# Patient Record
Sex: Female | Born: 1985
Health system: Southern US, Community
[De-identification: ages and names within clinical notes are randomized; demographics above are authoritative.]

## PROBLEM LIST (undated history)

## (undated) DIAGNOSIS — T8859XA Other complications of anesthesia, initial encounter: Secondary | ICD-10-CM

## (undated) DIAGNOSIS — R112 Nausea with vomiting, unspecified: Secondary | ICD-10-CM

## (undated) DIAGNOSIS — T4145XA Adverse effect of unspecified anesthetic, initial encounter: Secondary | ICD-10-CM

## (undated) DIAGNOSIS — Z9889 Other specified postprocedural states: Secondary | ICD-10-CM

## (undated) DIAGNOSIS — Z87898 Personal history of other specified conditions: Secondary | ICD-10-CM

## (undated) DIAGNOSIS — Z8619 Personal history of other infectious and parasitic diseases: Secondary | ICD-10-CM

## (undated) HISTORY — DX: Other complications of anesthesia, initial encounter: T88.59XA

## (undated) HISTORY — DX: Personal history of other infectious and parasitic diseases: Z86.19

## (undated) HISTORY — DX: Other specified postprocedural states: Z98.890

## (undated) HISTORY — PX: WISDOM TOOTH EXTRACTION: SHX21

## (undated) HISTORY — DX: Adverse effect of unspecified anesthetic, initial encounter: T41.45XA

## (undated) HISTORY — DX: Other specified postprocedural states: R11.2

## (undated) HISTORY — DX: Personal history of other specified conditions: Z87.898

---

## 2014-02-25 ENCOUNTER — Other Ambulatory Visit (HOSPITAL_COMMUNITY): Payer: Self-pay | Admitting: Obstetrics & Gynecology

## 2014-02-25 DIAGNOSIS — Z3141 Encounter for fertility testing: Secondary | ICD-10-CM

## 2014-03-05 ENCOUNTER — Ambulatory Visit (HOSPITAL_COMMUNITY)
Admission: RE | Admit: 2014-03-05 | Discharge: 2014-03-05 | Disposition: A | Discharge status: Home / Self Care | Payer: BLUE CROSS/BLUE SHIELD | Source: Ambulatory Visit | Attending: Obstetrics & Gynecology | Admitting: Obstetrics & Gynecology

## 2014-03-05 DIAGNOSIS — Z3141 Encounter for fertility testing: Secondary | ICD-10-CM | POA: Diagnosis not present

## 2014-03-05 MED ORDER — IOHEXOL 300 MG/ML  SOLN: 30.0000 mL | Freq: Once | INTRAMUSCULAR | Status: AC | PRN

## 2014-09-16 LAB — OB RESULTS CONSOLE ABO/RH: RH Type: POSITIVE

## 2014-09-16 LAB — OB RESULTS CONSOLE RUBELLA ANTIBODY, IGM: Rubella: IMMUNE

## 2014-09-16 LAB — OB RESULTS CONSOLE HEPATITIS B SURFACE ANTIGEN: Hepatitis B Surface Ag: NEGATIVE

## 2014-09-16 LAB — OB RESULTS CONSOLE GC/CHLAMYDIA
CHLAMYDIA, DNA PROBE: NEGATIVE
GC PROBE AMP, GENITAL: NEGATIVE

## 2014-09-16 LAB — OB RESULTS CONSOLE ANTIBODY SCREEN: ANTIBODY SCREEN: NEGATIVE

## 2014-09-16 LAB — OB RESULTS CONSOLE HIV ANTIBODY (ROUTINE TESTING): HIV: NONREACTIVE

## 2014-09-16 LAB — OB RESULTS CONSOLE RPR: RPR: NONREACTIVE

## 2015-01-05 NOTE — L&D Delivery Note (Signed)
Delivery Note At 12:35 AM a viable female was delivered via  OA Presentation.  APGAR: 8, 9; weight pending .   Placenta status: Intact, Spontaneous.  Cord: 3 vessels with the following complications:none .  Cord pH: not obtained  Anesthesia: Epidural  Episiotomy:  mediolateral Lacerations:  none Suture Repair: 3.0 vicryl Est. Blood Loss (mL):  300  Mom to postpartum.  Baby to Couplet care / Skin to Skin.  Anni Hocevar L 04/25/2015, 12:46 AM

## 2015-03-28 LAB — OB RESULTS CONSOLE GBS: STREP GROUP B AG: POSITIVE

## 2015-04-21 DIAGNOSIS — Z3A39 39 weeks gestation of pregnancy: Secondary | ICD-10-CM | POA: Diagnosis not present

## 2015-04-21 DIAGNOSIS — O480 Post-term pregnancy: Secondary | ICD-10-CM | POA: Diagnosis not present

## 2015-04-22 ENCOUNTER — Encounter (HOSPITAL_COMMUNITY): Payer: Self-pay | Admitting: *Deleted

## 2015-04-22 ENCOUNTER — Telehealth (HOSPITAL_COMMUNITY): Payer: Self-pay | Admitting: *Deleted

## 2015-04-22 NOTE — Telephone Encounter (Signed)
Preadmission screen  

## 2015-04-24 ENCOUNTER — Inpatient Hospital Stay (HOSPITAL_COMMUNITY): Payer: BLUE CROSS/BLUE SHIELD | Admitting: Anesthesiology

## 2015-04-24 ENCOUNTER — Inpatient Hospital Stay (HOSPITAL_COMMUNITY)
Admission: AD | Admit: 2015-04-24 | Discharge: 2015-04-27 | DRG: 775 | Disposition: A | Discharge status: Home / Self Care | Payer: BLUE CROSS/BLUE SHIELD | Source: Ambulatory Visit | Attending: Obstetrics and Gynecology | Admitting: Obstetrics and Gynecology

## 2015-04-24 ENCOUNTER — Encounter (HOSPITAL_COMMUNITY): Payer: Self-pay | Admitting: *Deleted

## 2015-04-24 DIAGNOSIS — O99824 Streptococcus B carrier state complicating childbirth: Principal | ICD-10-CM | POA: Diagnosis present

## 2015-04-24 DIAGNOSIS — Z8249 Family history of ischemic heart disease and other diseases of the circulatory system: Secondary | ICD-10-CM

## 2015-04-24 DIAGNOSIS — Z3A40 40 weeks gestation of pregnancy: Secondary | ICD-10-CM

## 2015-04-24 DIAGNOSIS — IMO0001 Reserved for inherently not codable concepts without codable children: Secondary | ICD-10-CM

## 2015-04-24 LAB — TYPE AND SCREEN
ABO/RH(D): A POS
Antibody Screen: NEGATIVE

## 2015-04-24 LAB — CBC
HCT: 34.8 % — ABNORMAL LOW (ref 36.0–46.0)
HEMOGLOBIN: 11.9 g/dL — AB (ref 12.0–15.0)
MCH: 30.7 pg (ref 26.0–34.0)
MCHC: 34.2 g/dL (ref 30.0–36.0)
MCV: 89.7 fL (ref 78.0–100.0)
Platelets: 184 10*3/uL (ref 150–400)
RBC: 3.88 MIL/uL (ref 3.87–5.11)
RDW: 12.9 % (ref 11.5–15.5)
WBC: 12.9 10*3/uL — ABNORMAL HIGH (ref 4.0–10.5)

## 2015-04-24 LAB — ABO/RH: ABO/RH(D): A POS

## 2015-04-24 MED ORDER — LIDOCAINE HCL (PF) 1 % IJ SOLN
INTRAMUSCULAR | Status: DC | PRN
  Administered 2015-04-24: 5 mL
  Administered 2015-04-24: 5 mL via EPIDURAL

## 2015-04-24 MED ORDER — EPHEDRINE 5 MG/ML INJ
10.0000 mg | INTRAVENOUS | Status: DC | PRN
  Filled 2015-04-24: qty 2

## 2015-04-24 MED ORDER — OXYTOCIN BOLUS FROM INFUSION
500.0000 mL | INTRAVENOUS | Status: DC
  Administered 2015-04-25: 500 mL via INTRAVENOUS

## 2015-04-24 MED ORDER — OXYCODONE-ACETAMINOPHEN 5-325 MG PO TABS: 1.0000 | ORAL_TABLET | ORAL | Status: DC | PRN

## 2015-04-24 MED ORDER — LACTATED RINGERS IV SOLN
500.0000 mL | Freq: Once | INTRAVENOUS | Status: AC
  Administered 2015-04-24: 500 mL via INTRAVENOUS

## 2015-04-24 MED ORDER — FENTANYL 2.5 MCG/ML BUPIVACAINE 1/10 % EPIDURAL INFUSION (WH - ANES)
14.0000 mL/h | INTRAMUSCULAR | Status: DC | PRN
  Administered 2015-04-24: 14 mL/h via EPIDURAL
  Filled 2015-04-24: qty 125

## 2015-04-24 MED ORDER — OXYTOCIN 10 UNIT/ML IJ SOLN
1.0000 m[IU]/min | INTRAMUSCULAR | Status: DC
  Administered 2015-04-24: 2 m[IU]/min via INTRAVENOUS
  Filled 2015-04-24: qty 4

## 2015-04-24 MED ORDER — PENICILLIN G POTASSIUM 5000000 UNITS IJ SOLR
5.0000 10*6.[IU] | Freq: Once | INTRAVENOUS | Status: AC
  Administered 2015-04-24: 5 10*6.[IU] via INTRAVENOUS
  Filled 2015-04-24: qty 5

## 2015-04-24 MED ORDER — CITRIC ACID-SODIUM CITRATE 334-500 MG/5ML PO SOLN
30.0000 mL | ORAL | Status: DC | PRN
  Filled 2015-04-24: qty 15

## 2015-04-24 MED ORDER — PHENYLEPHRINE 40 MCG/ML (10ML) SYRINGE FOR IV PUSH (FOR BLOOD PRESSURE SUPPORT)
80.0000 ug | PREFILLED_SYRINGE | INTRAVENOUS | Status: DC | PRN
Start: 2015-04-24 — End: 2015-04-25
  Administered 2015-04-24: 80 ug via INTRAVENOUS
  Filled 2015-04-24: qty 2

## 2015-04-24 MED ORDER — LACTATED RINGERS IV SOLN: 500.0000 mL | INTRAVENOUS | Status: DC | PRN

## 2015-04-24 MED ORDER — PHENYLEPHRINE 40 MCG/ML (10ML) SYRINGE FOR IV PUSH (FOR BLOOD PRESSURE SUPPORT)
80.0000 ug | PREFILLED_SYRINGE | INTRAVENOUS | Status: DC | PRN
  Filled 2015-04-24: qty 20
  Filled 2015-04-24: qty 2

## 2015-04-24 MED ORDER — TERBUTALINE SULFATE 1 MG/ML IJ SOLN
0.2500 mg | Freq: Once | INTRAMUSCULAR | Status: DC | PRN
  Filled 2015-04-24: qty 1

## 2015-04-24 MED ORDER — LIDOCAINE HCL (PF) 1 % IJ SOLN
30.0000 mL | INTRAMUSCULAR | Status: DC | PRN
  Filled 2015-04-24: qty 30

## 2015-04-24 MED ORDER — DIPHENHYDRAMINE HCL 50 MG/ML IJ SOLN: 12.5000 mg | INTRAMUSCULAR | Status: DC | PRN

## 2015-04-24 MED ORDER — FLEET ENEMA 7-19 GM/118ML RE ENEM: 1.0000 | ENEMA | Freq: Once | RECTAL | Status: DC

## 2015-04-24 MED ORDER — PENICILLIN G POTASSIUM 5000000 UNITS IJ SOLR
2.5000 10*6.[IU] | INTRAVENOUS | Status: DC
  Administered 2015-04-24: 2.5 10*6.[IU] via INTRAVENOUS
  Filled 2015-04-24 (×8): qty 2.5

## 2015-04-24 MED ORDER — ONDANSETRON HCL 4 MG/2ML IJ SOLN: 4.0000 mg | Freq: Four times a day (QID) | INTRAMUSCULAR | Status: DC | PRN

## 2015-04-24 MED ORDER — OXYTOCIN 10 UNIT/ML IJ SOLN
2.5000 [IU]/h | INTRAVENOUS | Status: DC
  Administered 2015-04-25: 2.5 [IU]/h via INTRAVENOUS

## 2015-04-24 MED ORDER — OXYCODONE-ACETAMINOPHEN 5-325 MG PO TABS: 2.0000 | ORAL_TABLET | ORAL | Status: DC | PRN

## 2015-04-24 MED ORDER — ACETAMINOPHEN 325 MG PO TABS
650.0000 mg | ORAL_TABLET | ORAL | Status: DC | PRN
  Administered 2015-04-25: 650 mg via ORAL
  Filled 2015-04-24: qty 2

## 2015-04-24 MED ORDER — LACTATED RINGERS IV SOLN
INTRAVENOUS | Status: DC
  Administered 2015-04-24 (×2): via INTRAVENOUS

## 2015-04-24 NOTE — Anesthesia Procedure Notes (Signed)
Epidural Patient location during procedure: OB Start time: 04/24/2015 8:13 PM End time: 04/24/2015 8:27 PM  Staffing Anesthesiologist: Heather RobertsSINGER, Kirsten Hunt Performed by: anesthesiologist   Preanesthetic Checklist Completed: patient identified, site marked, pre-op evaluation, timeout performed, IV checked, risks and benefits discussed and monitors and equipment checked  Epidural Patient position: sitting Prep: DuraPrep Patient monitoring: heart rate, cardiac monitor, continuous pulse ox and blood pressure Approach: midline Location: L2-L3 Injection technique: LOR saline  Needle:  Needle type: Tuohy  Needle gauge: 17 G Needle length: 9 cm Needle insertion depth: 5 cm Catheter size: 20 Guage Catheter at skin depth: 10 cm Test dose: negative and Other  Assessment Events: blood not aspirated, injection not painful, no injection resistance and negative IV test  Additional Notes Informed consent obtained prior to proceeding including risk of failure, 1% risk of PDPH, risk of minor discomfort and bruising.  Discussed rare but serious complications including epidural abscess, permanent nerve injury, epidural hematoma.  Discussed alternatives to epidural analgesia and patient desires to proceed.  Timeout performed pre-procedure verifying patient name, procedure, and platelet count.  Patient tolerated procedure well.

## 2015-04-24 NOTE — Anesthesia Preprocedure Evaluation (Signed)
Anesthesia Evaluation  Patient identified by MRN, date of birth, ID band Patient awake    Reviewed: Allergy & Precautions, NPO status , Patient's Chart, lab work & pertinent test results  History of Anesthesia Complications (+) PONV and history of anesthetic complications  Airway Mallampati: II  TM Distance: >3 FB Neck ROM: Full    Dental  (+) Teeth Intact, Dental Advisory Given   Pulmonary neg pulmonary ROS,    Pulmonary exam normal        Cardiovascular negative cardio ROS Normal cardiovascular exam     Neuro/Psych negative neurological ROS  negative psych ROS   GI/Hepatic negative GI ROS, Neg liver ROS,   Endo/Other  negative endocrine ROS  Renal/GU negative Renal ROS  negative genitourinary   Musculoskeletal negative musculoskeletal ROS (+)   Abdominal   Peds negative pediatric ROS (+)  Hematology negative hematology ROS (+)   Anesthesia Other Findings   Reproductive/Obstetrics (+) Pregnancy                             Anesthesia Physical Anesthesia Plan  ASA: II  Anesthesia Plan: Epidural   Post-op Pain Management:    Induction:   Airway Management Planned:   Additional Equipment:   Intra-op Plan:   Post-operative Plan:   Informed Consent: I have reviewed the patients History and Physical, chart, labs and discussed the procedure including the risks, benefits and alternatives for the proposed anesthesia with the patient or authorized representative who has indicated his/her understanding and acceptance.   Dental advisory given  Plan Discussed with: Anesthesiologist  Anesthesia Plan Comments:         Anesthesia Quick Evaluation

## 2015-04-24 NOTE — H&P (Signed)
Kirsten Hunt is a 30 y.o. G 1 P 0 at 40 w 2 days presents in labor.  GBBS + Maternal Medical History:  Reason for admission: Contractions.     OB History    Gravida Para Term Preterm AB TAB SAB Ectopic Multiple Living   1         0     Past Medical History  Diagnosis Date  . Hx of varicella   . Complication of anesthesia   . PONV (postoperative nausea and vomiting)    Past Surgical History  Procedure Laterality Date  . Wisdom tooth extraction     Family History: family history includes Cancer in her paternal aunt; Hypertension in her paternal grandmother; Migraines in her maternal aunt and mother; Thyroid disease in her maternal aunt and mother. Social History:  reports that she has never smoked. She has never used smokeless tobacco. She reports that she does not drink alcohol or use illicit drugs.   Prenatal Transfer Tool  Maternal Diabetes: No Genetic Screening: Normal Maternal Ultrasounds/Referrals: Normal Fetal Ultrasounds or other Referrals:  None Maternal Substance Abuse:  No Significant Maternal Medications:  None Significant Maternal Lab Results:  None Other Comments:  None  Review of Systems  All other systems reviewed and are negative.   Dilation: 5 Effacement (%): 80 Station: -2 Exam by:: L. Paschal, RN  Blood pressure 128/86, pulse 76, temperature 97.9 F (36.6 C), temperature source Oral, resp. rate 20, height 5\' 6"  (1.676 m), weight 92.987 kg (205 lb), last menstrual period 07/16/2014, SpO2 99 %. Maternal Exam:  Abdomen: Fetal presentation: vertex     Fetal Exam Fetal State Assessment: Category I - tracings are normal.     Physical Exam  Nursing note and vitals reviewed. Constitutional: She appears well-developed and well-nourished.  HENT:  Head: Normocephalic.  Eyes: Pupils are equal, round, and reactive to light.  Cardiovascular: Normal rate and regular rhythm.   Respiratory: Effort normal.    Prenatal labs: ABO, Rh: --/--/A POS  (04/20 1736) Antibody: NEG (04/20 1736) Rubella: Immune (09/12 0000) RPR: Nonreactive (09/12 0000)  HBsAg: Negative (09/12 0000)  HIV: Non-reactive (09/12 0000)  GBS: Positive (03/24 0000)   Assessment/Plan: IUP at 40 w 2 days Labor Patient tried the nitrous - did not help . Requests epidural. Follow labor curve  Marqueze Ramcharan L 04/24/2015, 8:03 PM

## 2015-04-24 NOTE — MAU Note (Signed)
Pt states she has been having contractions since 0530 this morning and progressively getting worse.  Now reports every 5 minutes.  Denies vaginal bleeding or ROM.  Good fetal movement.  Was 2-3 on Monday at office.

## 2015-04-25 ENCOUNTER — Encounter (HOSPITAL_COMMUNITY): Payer: Self-pay | Admitting: *Deleted

## 2015-04-25 LAB — CBC
HEMATOCRIT: 32 % — AB (ref 36.0–46.0)
HEMOGLOBIN: 11.1 g/dL — AB (ref 12.0–15.0)
MCH: 31.1 pg (ref 26.0–34.0)
MCHC: 34.7 g/dL (ref 30.0–36.0)
MCV: 89.6 fL (ref 78.0–100.0)
Platelets: 185 10*3/uL (ref 150–400)
RBC: 3.57 MIL/uL — ABNORMAL LOW (ref 3.87–5.11)
RDW: 12.9 % (ref 11.5–15.5)
WBC: 20.4 10*3/uL — ABNORMAL HIGH (ref 4.0–10.5)

## 2015-04-25 LAB — RPR: RPR Ser Ql: NONREACTIVE

## 2015-04-25 MED ORDER — ONDANSETRON HCL 4 MG PO TABS: 4.0000 mg | ORAL_TABLET | ORAL | Status: DC | PRN

## 2015-04-25 MED ORDER — ACETAMINOPHEN 325 MG PO TABS
650.0000 mg | ORAL_TABLET | ORAL | Status: DC | PRN
  Administered 2015-04-25 – 2015-04-26 (×4): 650 mg via ORAL
  Filled 2015-04-25 (×4): qty 2

## 2015-04-25 MED ORDER — FLEET ENEMA 7-19 GM/118ML RE ENEM: 1.0000 | ENEMA | Freq: Every day | RECTAL | Status: DC | PRN

## 2015-04-25 MED ORDER — IBUPROFEN 600 MG PO TABS
600.0000 mg | ORAL_TABLET | Freq: Four times a day (QID) | ORAL | Status: DC
  Administered 2015-04-25 – 2015-04-27 (×9): 600 mg via ORAL
  Filled 2015-04-25 (×9): qty 1

## 2015-04-25 MED ORDER — ONDANSETRON HCL 4 MG/2ML IJ SOLN: 4.0000 mg | INTRAMUSCULAR | Status: DC | PRN

## 2015-04-25 MED ORDER — PRENATAL MULTIVITAMIN CH
1.0000 | ORAL_TABLET | Freq: Every day | ORAL | Status: DC
  Administered 2015-04-25 – 2015-04-26 (×2): 1 via ORAL
  Filled 2015-04-25 (×2): qty 1

## 2015-04-25 MED ORDER — DIPHENHYDRAMINE HCL 25 MG PO CAPS: 25.0000 mg | ORAL_CAPSULE | Freq: Four times a day (QID) | ORAL | Status: DC | PRN

## 2015-04-25 MED ORDER — MEASLES, MUMPS & RUBELLA VAC ~~LOC~~ INJ
0.5000 mL | INJECTION | Freq: Once | SUBCUTANEOUS | Status: DC
  Filled 2015-04-25: qty 0.5

## 2015-04-25 MED ORDER — MEDROXYPROGESTERONE ACETATE 150 MG/ML IM SUSP: 150.0000 mg | INTRAMUSCULAR | Status: DC | PRN

## 2015-04-25 MED ORDER — BENZOCAINE-MENTHOL 20-0.5 % EX AERO
1.0000 "application " | INHALATION_SPRAY | CUTANEOUS | Status: DC | PRN
  Administered 2015-04-25: 1 via TOPICAL
  Filled 2015-04-25: qty 56

## 2015-04-25 MED ORDER — COCONUT OIL OIL
1.0000 "application " | TOPICAL_OIL | Status: DC | PRN
  Administered 2015-04-26: 1 via TOPICAL
  Filled 2015-04-25: qty 120

## 2015-04-25 MED ORDER — DIBUCAINE 1 % RE OINT: 1.0000 "application " | TOPICAL_OINTMENT | RECTAL | Status: DC | PRN

## 2015-04-25 MED ORDER — WITCH HAZEL-GLYCERIN EX PADS: 1.0000 "application " | MEDICATED_PAD | CUTANEOUS | Status: DC | PRN

## 2015-04-25 MED ORDER — BISACODYL 10 MG RE SUPP: 10.0000 mg | Freq: Every day | RECTAL | Status: DC | PRN

## 2015-04-25 MED ORDER — SENNOSIDES-DOCUSATE SODIUM 8.6-50 MG PO TABS
2.0000 | ORAL_TABLET | ORAL | Status: DC
  Administered 2015-04-26: 2 via ORAL
  Filled 2015-04-25: qty 2

## 2015-04-25 MED ORDER — SIMETHICONE 80 MG PO CHEW: 80.0000 mg | CHEWABLE_TABLET | ORAL | Status: DC | PRN

## 2015-04-25 MED ORDER — ZOLPIDEM TARTRATE 5 MG PO TABS: 5.0000 mg | ORAL_TABLET | Freq: Every evening | ORAL | Status: DC | PRN

## 2015-04-25 MED ORDER — TETANUS-DIPHTH-ACELL PERTUSSIS 5-2.5-18.5 LF-MCG/0.5 IM SUSP: 0.5000 mL | Freq: Once | INTRAMUSCULAR | Status: DC

## 2015-04-25 NOTE — Lactation Note (Signed)
This note was copied from a baby's chart. Lactation Consultation Note  Patient Name: Kirsten Julian ReilMary Kovarik [id number]'s Date: 04/25/2015 Reason for consult: Initial assessment Mom reports baby not latching, RN has assisted with hand expression and spoon feeding baby some colostrum. Attempted to help Mom with latching baby, baby having difficulty obtaining and sustaining the latch, Mom has short nipple shafts and aerola edema. Used hand pump to pre-pump and demonstrated breast compression, baby would take few suckles off/on then lose the latch. Tried several positions with same result. Reviewed hand expression and spoon feeding colostrum. Gave Mom breast shells to wear, encouraged to pre-pump and keep working with latch. Mom has her own DEBP and advised if latch does not improve to start pre-pumping to encourage milk production. If latch does not improve discussed possible use of nipple shield to help with latch. Advised to continue to attempt/latch with feeding ques. Encouraged to call for assist till baby is latching. Lactation brochure left for review, advised of OP services and support group.   Maternal Data Has patient been taught Hand Expression?: Yes Does the patient have breastfeeding experience prior to this delivery?: No  Feeding Feeding Type: Breast Milk Length of feed: 5 min (off/on)  LATCH Score/Interventions Latch: Repeated attempts needed to sustain latch, nipple held in mouth throughout feeding, stimulation needed to elicit sucking reflex. Intervention(s): Adjust position;Assist with latch;Breast massage;Breast compression  Audible Swallowing: A few with stimulation Intervention(s): Skin to skin;Hand expression  Type of Nipple: Flat  Comfort (Breast/Nipple): Filling, red/small blisters or bruises, mild/mod discomfort  Problem noted: Mild/Moderate discomfort  Hold (Positioning): Assistance needed to correctly position infant at breast and maintain latch. Intervention(s):  Breastfeeding basics reviewed;Position options;Support Pillows;Skin to skin  LATCH Score: 5  Lactation Tools Discussed/Used WIC Program: No   Consult Status Consult Status: Follow-up Date: 04/26/15 Follow-up type: In-patient    Alfred LevinsGranger, Kariann Wecker Ann 04/25/2015, 5:05 PM

## 2015-04-25 NOTE — Progress Notes (Signed)
Post Partum Day 0 Subjective: no complaints, up ad lib, voiding and tolerating PO  Objective: Blood pressure 98/48, pulse 78, temperature 98.5 F (36.9 C), temperature source Oral, resp. rate 18, height 5\' 6"  (1.676 m), weight 205 lb (92.987 kg), last menstrual period 07/16/2014, SpO2 98 %, unknown if currently breastfeeding.  Physical Exam:  General: alert and cooperative Lochia: appropriate Uterine Fundus: firm Incision: perineum intact DVT Evaluation: No evidence of DVT seen on physical exam. Negative Homan's sign. No cords or calf tenderness. No significant calf/ankle edema.   Recent Labs  04/24/15 1736 04/25/15 0622  HGB 11.9* 11.1*  HCT 34.8* 32.0*    Assessment/Plan: Plan for discharge tomorrow   LOS: 1 day   CURTIS,CAROL G 04/25/2015, 9:01 AM

## 2015-04-25 NOTE — Anesthesia Postprocedure Evaluation (Signed)
Anesthesia Post Note  Patient: Kirsten ReilMary Slovacek  Procedure(s) Performed: * No procedures listed *  Patient location during evaluation: Mother Baby Anesthesia Type: Epidural Level of consciousness: awake Pain management: satisfactory to patient Vital Signs Assessment: post-procedure vital signs reviewed and stable Respiratory status: spontaneous breathing Cardiovascular status: stable Anesthetic complications: no    Last Vitals:  Filed Vitals:   04/25/15 0400 04/25/15 0753  BP: 109/51 98/48  Pulse: 73 78  Temp: 37.2 C 36.9 C  Resp: 18 18    Last Pain:  Filed Vitals:   04/25/15 0947  PainSc: 5                  Symphony Demuro

## 2015-04-26 NOTE — Progress Notes (Signed)
Post Partum Day 1 Subjective: no complaints, up ad lib, voiding and tolerating PO  Objective: Blood pressure 115/69, pulse 64, temperature 97.5 F (36.4 C), temperature source Oral, resp. rate 18, height 5\' 6"  (1.676 m), weight 205 lb (92.987 kg), last menstrual period 07/16/2014, SpO2 99 %, unknown if currently breastfeeding.  Physical Exam:  General: alert, cooperative, appears stated age and no distress Lochia: appropriate Uterine Fundus: firm Incision: healing well DVT Evaluation: No evidence of DVT seen on physical exam.   Recent Labs  04/24/15 1736 04/25/15 0622  HGB 11.9* 11.1*  HCT 34.8* 32.0*    Assessment/Plan: Plan for discharge tomorrow and Breastfeeding   LOS: 2 days   Keon Pender C 04/26/2015, 10:22 AM

## 2015-04-27 NOTE — Discharge Summary (Signed)
Obstetric Discharge Summary Reason for Admission: onset of labor Prenatal Procedures: none Intrapartum Procedures: spontaneous vaginal delivery Postpartum Procedures: none Complications-Operative and Postpartum: none HEMOGLOBIN  Date Value Ref Range Status  04/25/2015 11.1* 12.0 - 15.0 g/dL Final   HCT  Date Value Ref Range Status  04/25/2015 32.0* 36.0 - 46.0 % Final    Physical Exam:  General: alert, cooperative, appears stated age and no distress Lochia: appropriate Uterine Fundus: firm Incision: healing well DVT Evaluation: No evidence of DVT seen on physical exam.  Discharge Diagnoses: Term Pregnancy-delivered  Discharge Information: Date: 04/27/2015 Activity: pelvic rest Diet: routine Medications: None Condition: stable Instructions: refer to practice specific booklet Discharge to: home   Newborn Data: Live born female  Birth Weight: 7 lb (3175 g) APGAR: 8, 9  Home with mother.  Deryck Hippler C 04/27/2015, 9:28 AM

## 2015-04-29 ENCOUNTER — Inpatient Hospital Stay (HOSPITAL_COMMUNITY): Admission: RE | Admit: 2015-04-29 | Payer: BLUE CROSS/BLUE SHIELD | Source: Ambulatory Visit

## 2015-05-05 ENCOUNTER — Telehealth (HOSPITAL_COMMUNITY): Payer: Self-pay | Admitting: Lactation Services

## 2015-05-05 NOTE — Telephone Encounter (Signed)
Mom called, left message and I called her back. She has concerns about plugged duct. Reviewed care of plugged duct with mom- heat, massage. Mom is not putting baby to the breast- states she will not latch . Suggested trying to latch baby to help with plugged duct. Has OP appointment here for Wednesday. No fever, achiness or signs of mastitis.Will call her if we have cancellation tomorrow No further questions at present.

## 2015-05-06 ENCOUNTER — Ambulatory Visit (HOSPITAL_COMMUNITY)
Admission: RE | Admit: 2015-05-06 | Discharge: 2015-05-06 | Disposition: A | Discharge status: Home / Self Care | Payer: BLUE CROSS/BLUE SHIELD | Source: Ambulatory Visit | Attending: Obstetrics & Gynecology | Admitting: Obstetrics & Gynecology

## 2015-05-06 NOTE — Lactation Note (Signed)
Lactation Consult  Mother's reason for visit:  Assess plugged duct and assist with getting Kirsten ManisElizabeth to latch. Visit Type:  OP Appointment Notes:  [name] has a plugged duct on the left breast between 1100-1300.  Assisted her with massage, hand expression and pumping. A bleb was also noted on the nipple and milk was flowing around it but Mother reports that previously milk was spraying from the area.  It softened somewhat and she reported that it felt better. Flange size on the left was changed to a #30 because [name] is having nipple pain with pumping. She reports that it is on the tip of the nipple but denies they were ever inverted. Flange on the right was decreased to a #21 because the areola was breaking contact with it. She reported that it felt like it was "pulling" less but was unsure if it was more comfortable.  Bruising was also noted around the areola.  Encouraged her to continue the routine performed and to call her doctor if signs or symptoms of mastitis arose. Kirsten Hunt has not been latching to the breast. Mom had to stop latching related to pain.  Attempted to latch her to the right breast both with and without a NS. Pain was a 7 on the pain scale and mom did not feel this was sustainable.  Baby was not well organized so she was bottle fed expressed BM.  It is noted that her gums are flat, she does not flange her upper lip well and does not extend her tongue when her mouth is open. Her tongue also does not elevate when she cries. These observations are suggestive of tongue restriction. Mother was given resources to learn about this and also the names of providers in the area who are experts in this field.  She may opt to pump and bottle feed. Follow-up as mother desires. Consult:  Initial Lactation Consultant:  Soyla DryerJoseph, Tyreonna Czaplicki  ________________________________________________________________________ Joan FloresBaby's Name: Kirsten MusaElizabeth Kay Hunt Date of Birth: 04/25/2016 Pediatrician: Dario GuardianPudlo Gender:  female Gestational Age: 6318w3d (At Birth) Birth Weight: 7 lb (3175 g) Weight at Discharge: Weight: 6 lb 10.2 oz (3011 g)Date of Discharge: 04/27/2015 Filed Weights   04/25/15 1607 04/26/15 1602 04/27/15 0027  Weight: 6 lb 11.2 oz (3040 g) 6 lb 10.4 oz (3016 g) 6 lb 10.2 oz (3011 g)    Weight today: 7#2.8 oz      ________________________________________________________________________  Mother's Name: [name] Type of delivery:   Breastfeeding Experience:  First baby Maternal Medical Conditions:  none Maternal Medications:  PNV  ________________________________________________________________________  Breastfeeding History (Post Discharge)  Frequency of breastfeeding:  Not latching Duration of feeding:      Pumping  Type of pump:  Medela pump in style Frequency:  Every 3 hours Volume:  4-6 oz  Infant Intake and Output Assessment  Voids:  8-10 in 24 hrs.  Color:  Clear yellow Stools:  3-4 in 24 hrs.  Color:  Yellow  ________________________________________________________________________  Maternal Breast Assessment  Breast:  Full and plugged duct on the left breast Nipple:  Erect and Reddened Pain level:  3 Pain interventions:  Cream/oil, Expressed breast milk and Lanolin advised stopping lanolin  _______________________________________________________________________

## 2015-05-07 ENCOUNTER — Ambulatory Visit (HOSPITAL_COMMUNITY): Payer: BLUE CROSS/BLUE SHIELD

## 2015-06-09 DIAGNOSIS — Z1389 Encounter for screening for other disorder: Secondary | ICD-10-CM | POA: Diagnosis not present

## 2015-12-19 IMAGING — RF DG HYSTEROGRAM
3 series · 3 of 3 positions shown · non-contrast
Comparison: None.

CLINICAL DATA: Fertility testing

EXAM:
HYSTEROSALPINGOGRAM
TECHNIQUE: Hysterosalpingogram was performed by the ordering physician under
fluoroscopy. Fluoroscopic images were submitted for radiologic
interpretation following the procedure. Please see the procedural
report for the amount of contrast and the fluoroscopy time utilized.

[Series 1: run · 1 of 1 slices shown (1 of 3)]
[im 1/1]
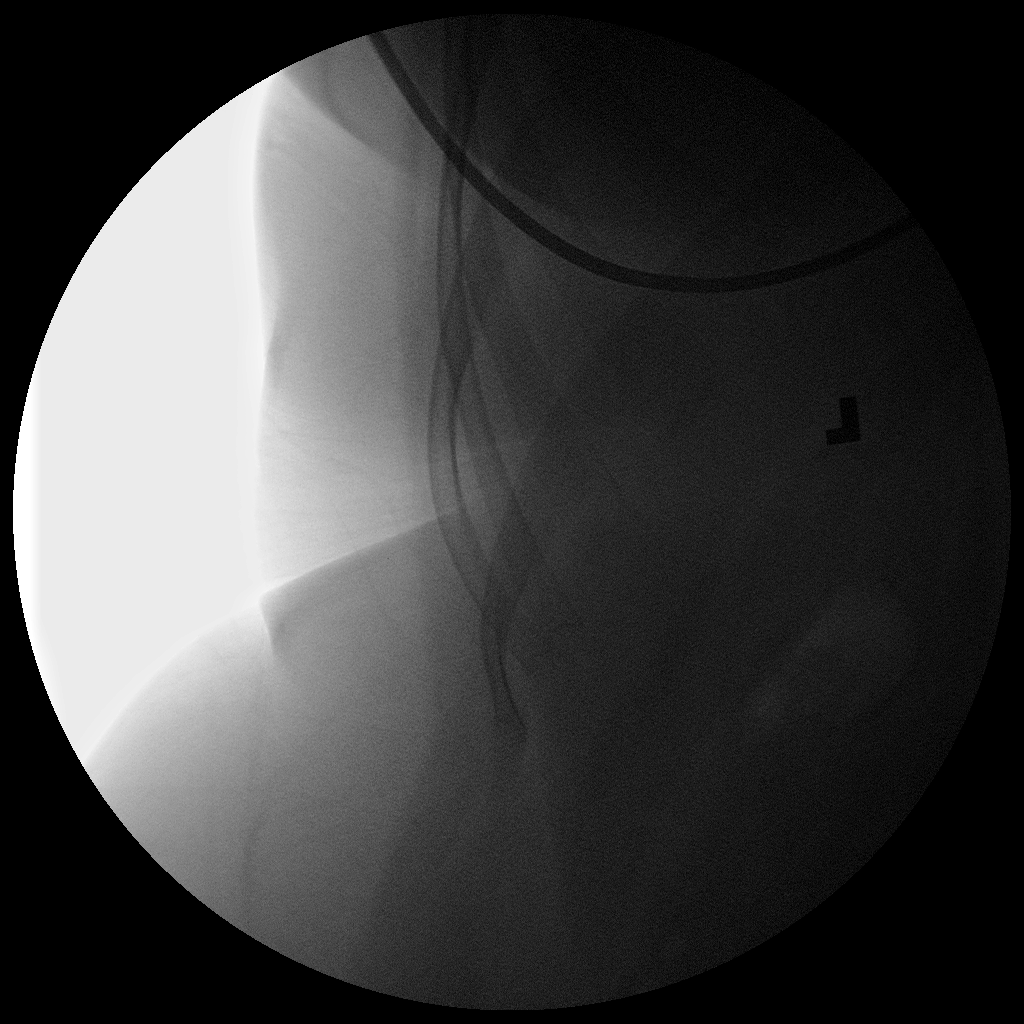

[Series 2: run · 1 of 1 slices shown (2 of 3)]
[im 1/1]
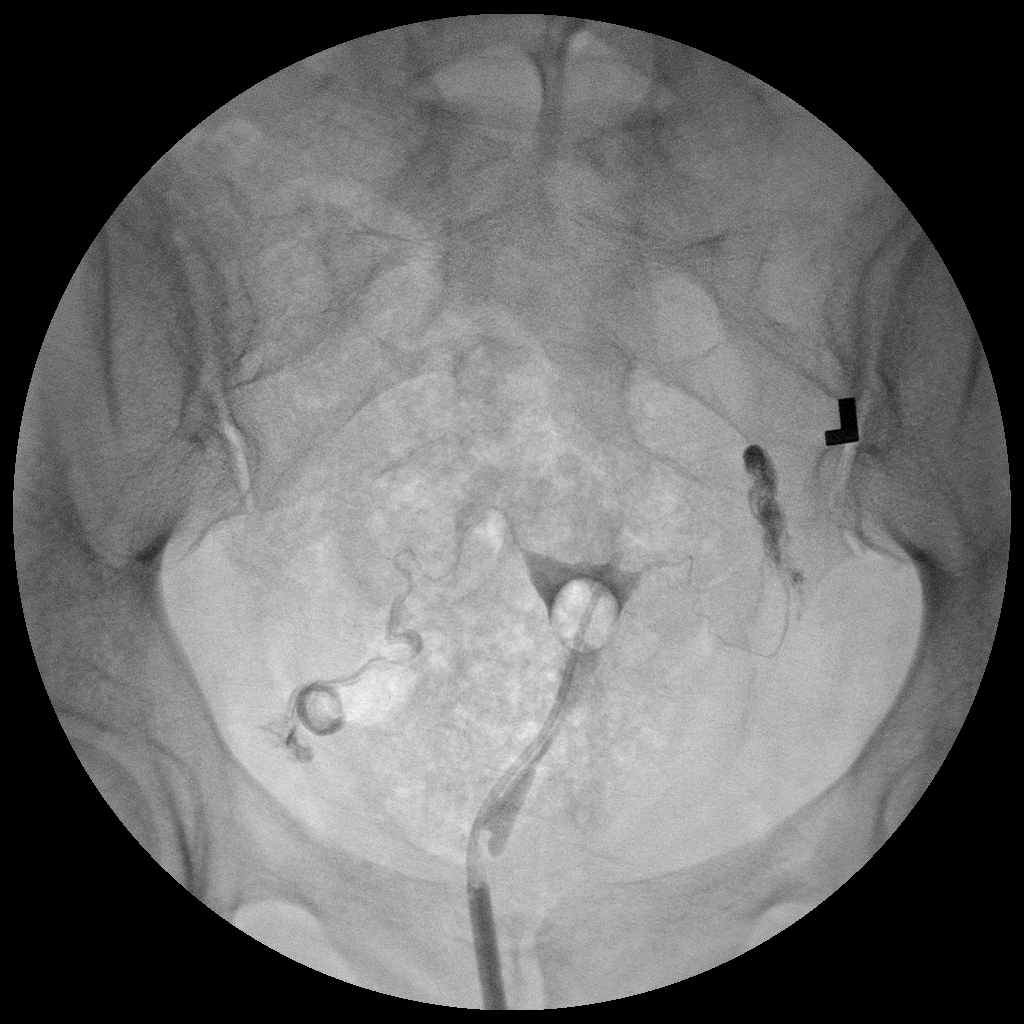

[Series 3: run · 1 of 1 slices shown (3 of 3)]
[im 1/1]
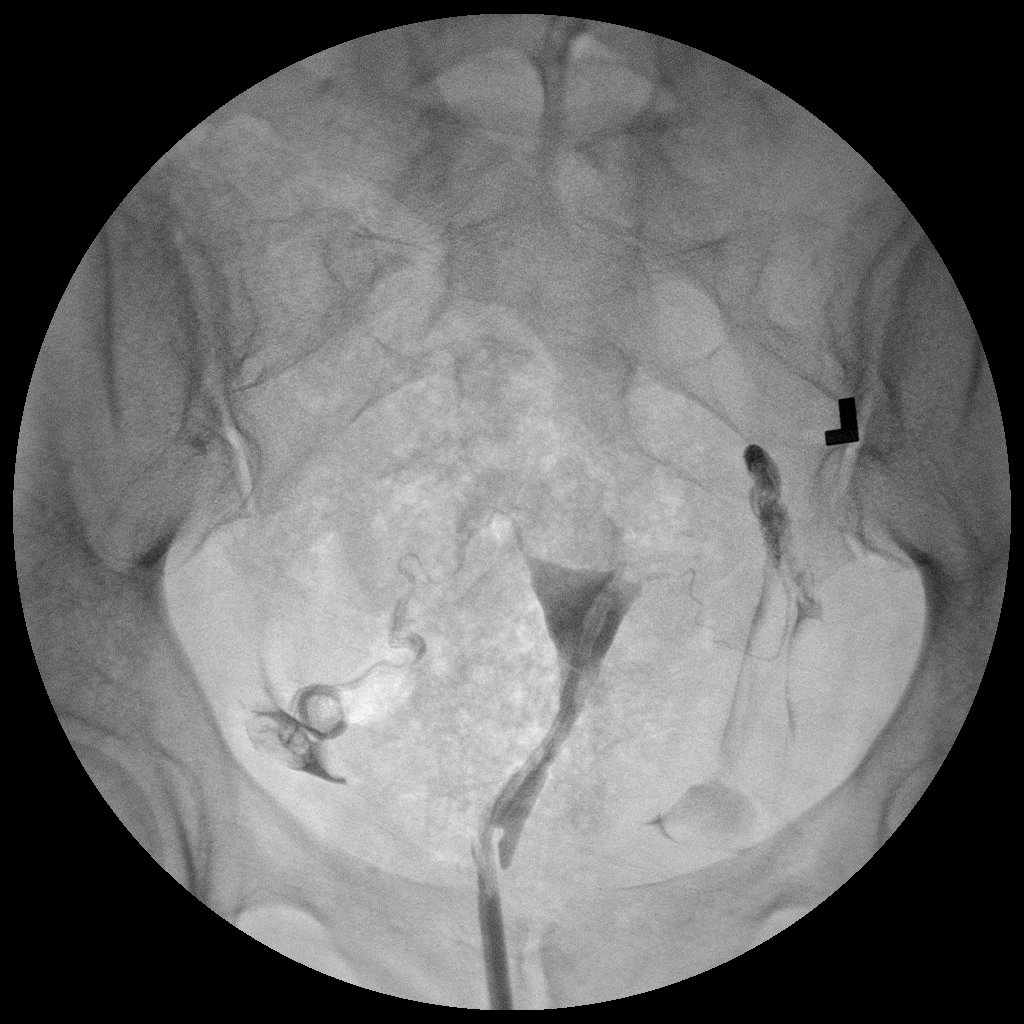

[3 of 3 positions shown; findings below may reference images not displayed]

FINDINGS: Two images are submitted. Normal appearance of the endometrial
cavity. Fallopian tubes appear normal. Probable early spillage from
both fallopian tubes on final image.
IMPRESSION: Unremarkable study.

## 2016-01-02 DIAGNOSIS — N979 Female infertility, unspecified: Secondary | ICD-10-CM | POA: Diagnosis not present

## 2016-01-06 DIAGNOSIS — D1801 Hemangioma of skin and subcutaneous tissue: Secondary | ICD-10-CM | POA: Diagnosis not present

## 2016-01-06 DIAGNOSIS — L821 Other seborrheic keratosis: Secondary | ICD-10-CM | POA: Diagnosis not present

## 2016-01-06 DIAGNOSIS — L814 Other melanin hyperpigmentation: Secondary | ICD-10-CM | POA: Diagnosis not present

## 2016-02-10 DIAGNOSIS — N979 Female infertility, unspecified: Secondary | ICD-10-CM | POA: Diagnosis not present

## 2016-02-17 DIAGNOSIS — N979 Female infertility, unspecified: Secondary | ICD-10-CM | POA: Diagnosis not present

## 2016-03-10 DIAGNOSIS — N979 Female infertility, unspecified: Secondary | ICD-10-CM | POA: Diagnosis not present

## 2016-04-12 DIAGNOSIS — N979 Female infertility, unspecified: Secondary | ICD-10-CM | POA: Diagnosis not present

## 2016-09-03 DIAGNOSIS — Z3169 Encounter for other general counseling and advice on procreation: Secondary | ICD-10-CM | POA: Diagnosis not present

## 2016-09-07 ENCOUNTER — Ambulatory Visit (INDEPENDENT_AMBULATORY_CARE_PROVIDER_SITE_OTHER): Payer: BLUE CROSS/BLUE SHIELD | Admitting: Family Medicine

## 2016-09-07 ENCOUNTER — Encounter: Payer: Self-pay | Admitting: Family Medicine

## 2016-09-07 VITALS — BP 124/78 | HR 95 | Temp 98.4°F | Ht 66.0 in | Wt 156.8 lb

## 2016-09-07 DIAGNOSIS — Z13220 Encounter for screening for lipoid disorders: Secondary | ICD-10-CM | POA: Diagnosis not present

## 2016-09-07 DIAGNOSIS — Z0000 Encounter for general adult medical examination without abnormal findings: Secondary | ICD-10-CM

## 2016-09-07 DIAGNOSIS — Z23 Encounter for immunization: Secondary | ICD-10-CM

## 2016-09-07 NOTE — Progress Notes (Signed)
Subjective:    Kirsten Hunt is a 31 y.o. female and is here for a comprehensive physical exam.  Pertinent Gynecological History: Patient's last menstrual period was 08/04/2016 (exact date). Sexually active: Yes with female. Menses: regular every month without intermenstrual spotting Sexually transmitted diseases: no past history Last pap: normal   OB History    Gravida Para Term Preterm AB Living   1 1 1     1    SAB TAB Ectopic Multiple Live Births         0 1     There are no preventive care reminders to display for this patient. PMHx, SurgHx, SocialHx, Medications, and Allergies were reviewed in the Visit Navigator and updated as appropriate.   Past Medical History:  Diagnosis Date  . Complication of anesthesia   . History of vertigo   . Hx of varicella   . PONV (postoperative nausea and vomiting)    Past Surgical History:  Procedure Laterality Date  . WISDOM TOOTH EXTRACTION     Family History  Problem Relation Age of Onset  . Thyroid disease Mother   . Migraines Mother   . Thyroid disease Maternal Aunt   . Migraines Maternal Aunt   . Cancer Paternal Aunt   . Hypertension Paternal Grandmother    Social History  Substance Use Topics  . Smoking status: Never Smoker  . Smokeless tobacco: Never Used  . Alcohol use Yes     Comment: Occasional   Review of Systems:   Pertinent items are noted in the HPI. Otherwise, ROS is negative.  Objective:   BP 124/78   Pulse 95   Temp 98.4 F (36.9 C) (Oral)   Ht 5\' 6"  (1.676 m)   Wt 156 lb 12.8 oz (71.1 kg)   LMP 08/04/2016 (Exact Date)   SpO2 99%   BMI 25.31 kg/m    Wt Readings from Last 3 Encounters:  09/07/16 156 lb 12.8 oz (71.1 kg)  04/24/15 205 lb (93 kg)     Ht Readings from Last 3 Encounters:  09/07/16 5\' 6"  (1.676 m)  04/24/15 5\' 6"  (1.676 m)   General appearance: alert, cooperative and appears stated age. Head: normocephalic, without obvious abnormality, atraumatic. Neck: no adenopathy, supple,  symmetrical, trachea midline; thyroid not enlarged, symmetric, no tenderness/mass/nodules. Lungs: clear to auscultation bilaterally. Heart: regular rate and rhythm Abdomen: soft, non-tender; no masses,  no organomegaly. Extremities: extremities normal, atraumatic, no cyanosis or edema. Skin: skin color, texture, turgor normal, no rashes or lesions. Lymph: cervical, supraclavicular, and axillary nodes normal; no abnormal inguinal nodes palpated. Neurologic: grossly normal.  Assessment/Plan:   Shelbia was seen today for establish care.  Diagnoses and all orders for this visit:  Routine physical examination -     Lipid panel; Future -     CBC; Future -     Comprehensive metabolic panel; Future  Screening for lipid disorders -     Lipid panel; Future  Need for immunization against influenza -     Flu Vaccine QUAD 36+ mos IM   Patient Counseling:   [x]     Nutrition: Stressed importance of moderation in sodium/caffeine intake, saturated fat and cholesterol, caloric balance, sufficient intake of fresh fruits, vegetables, fiber, calcium, iron, and 1 mg of folate supplement per day (for females capable of pregnancy).   [x]      Stressed the importance of regular exercise.    [x]     Substance Abuse: Discussed cessation/primary prevention of tobacco, alcohol, or other drug use;  driving or other dangerous activities under the influence; availability of treatment for abuse.    [x]      Injury prevention: Discussed safety belts, safety helmets, smoke detector, smoking near bedding or upholstery.    [x]      Sexuality: Discussed sexually transmitted diseases, partner selection, use of condoms, avoidance of unintended pregnancy  and contraceptive alternatives.    [x]     Dental health: Discussed importance of regular tooth brushing, flossing, and dental visits.   [x]      Health maintenance and immunizations reviewed. Please refer to Health maintenance section.   Helane RimaErica Shizuye Rupert,  DO [facility] [place] [facility]

## 2016-09-08 ENCOUNTER — Other Ambulatory Visit (INDEPENDENT_AMBULATORY_CARE_PROVIDER_SITE_OTHER): Payer: BLUE CROSS/BLUE SHIELD

## 2016-09-08 DIAGNOSIS — Z0000 Encounter for general adult medical examination without abnormal findings: Secondary | ICD-10-CM

## 2016-09-08 DIAGNOSIS — Z13220 Encounter for screening for lipoid disorders: Secondary | ICD-10-CM

## 2016-09-08 LAB — COMPREHENSIVE METABOLIC PANEL
ALT: 10 U/L (ref 0–35)
AST: 19 U/L (ref 0–37)
Albumin: 4.2 g/dL (ref 3.5–5.2)
Alkaline Phosphatase: 50 U/L (ref 39–117)
BUN: 11 mg/dL (ref 6–23)
CO2: 29 mEq/L (ref 19–32)
Calcium: 9.2 mg/dL (ref 8.4–10.5)
Chloride: 102 mEq/L (ref 96–112)
Creatinine, Ser: 0.75 mg/dL (ref 0.40–1.20)
GFR: 94.91 mL/min (ref >60.00)
Glucose, Bld: 106 mg/dL — ABNORMAL HIGH (ref 70–99)
Potassium: 3.9 mEq/L (ref 3.5–5.1)
Sodium: 137 mEq/L (ref 135–145)
Total Bilirubin: 0.6 mg/dL (ref 0.2–1.2)
Total Protein: 7 g/dL (ref 6.0–8.3)

## 2016-09-08 LAB — LIPID PANEL
Cholesterol: 176 mg/dL (ref 0–200)
HDL: 56.2 mg/dL (ref >39.00)
LDL Cholesterol: 105 mg/dL — ABNORMAL HIGH (ref 0–99)
NonHDL: 119.64
Total CHOL/HDL Ratio: 3
Triglycerides: 72 mg/dL (ref 0.0–149.0)
VLDL: 14.4 mg/dL (ref 0.0–40.0)

## 2016-09-08 LAB — CBC
HCT: 41.2 % (ref 36.0–46.0)
Hemoglobin: 13.5 g/dL (ref 12.0–15.0)
MCHC: 32.7 g/dL (ref 30.0–36.0)
MCV: 93.7 fl (ref 78.0–100.0)
Platelets: 152 10*3/uL (ref 150.0–400.0)
RBC: 4.4 Mil/uL (ref 3.87–5.11)
RDW: 13.4 % (ref 11.5–15.5)
WBC: 4.8 10*3/uL (ref 4.0–10.5)

## 2016-09-09 DIAGNOSIS — Z0000 Encounter for general adult medical examination without abnormal findings: Secondary | ICD-10-CM | POA: Insufficient documentation

## 2016-09-23 DIAGNOSIS — Z3141 Encounter for fertility testing: Secondary | ICD-10-CM | POA: Diagnosis not present

## 2016-09-27 DIAGNOSIS — Z3141 Encounter for fertility testing: Secondary | ICD-10-CM | POA: Diagnosis not present

## 2016-09-29 DIAGNOSIS — Z3141 Encounter for fertility testing: Secondary | ICD-10-CM | POA: Diagnosis not present

## 2016-10-04 DIAGNOSIS — Z6824 Body mass index (BMI) 24.0-24.9, adult: Secondary | ICD-10-CM | POA: Diagnosis not present

## 2016-10-04 DIAGNOSIS — Z01419 Encounter for gynecological examination (general) (routine) without abnormal findings: Secondary | ICD-10-CM | POA: Diagnosis not present

## 2016-10-05 DIAGNOSIS — Z3141 Encounter for fertility testing: Secondary | ICD-10-CM | POA: Diagnosis not present

## 2016-10-28 DIAGNOSIS — Z3141 Encounter for fertility testing: Secondary | ICD-10-CM | POA: Diagnosis not present

## 2016-11-02 DIAGNOSIS — Z3141 Encounter for fertility testing: Secondary | ICD-10-CM | POA: Diagnosis not present

## 2016-11-05 DIAGNOSIS — Z3141 Encounter for fertility testing: Secondary | ICD-10-CM | POA: Diagnosis not present

## 2016-12-01 DIAGNOSIS — Z3141 Encounter for fertility testing: Secondary | ICD-10-CM | POA: Diagnosis not present

## 2017-01-03 DIAGNOSIS — Z3141 Encounter for fertility testing: Secondary | ICD-10-CM | POA: Diagnosis not present

## 2017-01-05 DIAGNOSIS — Z3141 Encounter for fertility testing: Secondary | ICD-10-CM | POA: Diagnosis not present

## 2017-02-03 DIAGNOSIS — Z3141 Encounter for fertility testing: Secondary | ICD-10-CM | POA: Diagnosis not present

## 2017-02-16 DIAGNOSIS — Z3141 Encounter for fertility testing: Secondary | ICD-10-CM | POA: Diagnosis not present

## 2017-02-21 DIAGNOSIS — Z3141 Encounter for fertility testing: Secondary | ICD-10-CM | POA: Diagnosis not present

## 2017-02-23 DIAGNOSIS — Z3141 Encounter for fertility testing: Secondary | ICD-10-CM | POA: Diagnosis not present

## 2017-02-25 DIAGNOSIS — Z3141 Encounter for fertility testing: Secondary | ICD-10-CM | POA: Diagnosis not present

## 2017-02-28 DIAGNOSIS — Z3141 Encounter for fertility testing: Secondary | ICD-10-CM | POA: Diagnosis not present

## 2017-03-03 DIAGNOSIS — Z3189 Encounter for other procreative management: Secondary | ICD-10-CM | POA: Diagnosis not present

## 2017-03-03 DIAGNOSIS — N979 Female infertility, unspecified: Secondary | ICD-10-CM | POA: Diagnosis not present

## 2017-04-21 DIAGNOSIS — Z3200 Encounter for pregnancy test, result unknown: Secondary | ICD-10-CM | POA: Diagnosis not present

## 2017-05-23 DIAGNOSIS — Z3A08 8 weeks gestation of pregnancy: Secondary | ICD-10-CM | POA: Diagnosis not present

## 2017-05-23 DIAGNOSIS — Z13228 Encounter for screening for other metabolic disorders: Secondary | ICD-10-CM | POA: Diagnosis not present

## 2017-05-23 DIAGNOSIS — Z3685 Encounter for antenatal screening for Streptococcus B: Secondary | ICD-10-CM | POA: Diagnosis not present

## 2017-05-23 DIAGNOSIS — Z3481 Encounter for supervision of other normal pregnancy, first trimester: Secondary | ICD-10-CM | POA: Diagnosis not present

## 2017-05-23 DIAGNOSIS — Z3480 Encounter for supervision of other normal pregnancy, unspecified trimester: Secondary | ICD-10-CM | POA: Diagnosis not present

## 2017-05-23 LAB — OB RESULTS CONSOLE ABO/RH: RH TYPE: POSITIVE

## 2017-05-23 LAB — OB RESULTS CONSOLE RPR
RPR: NONREACTIVE
RPR: NONREACTIVE

## 2017-05-23 LAB — OB RESULTS CONSOLE HIV ANTIBODY (ROUTINE TESTING)
HIV: NONREACTIVE
HIV: NONREACTIVE

## 2017-05-23 LAB — OB RESULTS CONSOLE GC/CHLAMYDIA
CHLAMYDIA, DNA PROBE: NEGATIVE
Chlamydia: NEGATIVE
GC PROBE AMP, GENITAL: NEGATIVE
Gonorrhea: NEGATIVE

## 2017-05-23 LAB — OB RESULTS CONSOLE RUBELLA ANTIBODY, IGM
RUBELLA: IMMUNE
Rubella: IMMUNE

## 2017-05-23 LAB — OB RESULTS CONSOLE HEPATITIS B SURFACE ANTIGEN
HEP B S AG: NEGATIVE
HEP B S AG: NEGATIVE

## 2017-05-23 LAB — OB RESULTS CONSOLE ANTIBODY SCREEN: Antibody Screen: NEGATIVE

## 2017-06-02 DIAGNOSIS — Z3491 Encounter for supervision of normal pregnancy, unspecified, first trimester: Secondary | ICD-10-CM | POA: Diagnosis not present

## 2017-06-02 DIAGNOSIS — Z113 Encounter for screening for infections with a predominantly sexual mode of transmission: Secondary | ICD-10-CM | POA: Diagnosis not present

## 2017-06-23 DIAGNOSIS — D1801 Hemangioma of skin and subcutaneous tissue: Secondary | ICD-10-CM | POA: Diagnosis not present

## 2017-06-23 DIAGNOSIS — D225 Melanocytic nevi of trunk: Secondary | ICD-10-CM | POA: Diagnosis not present

## 2017-06-23 DIAGNOSIS — L814 Other melanin hyperpigmentation: Secondary | ICD-10-CM | POA: Diagnosis not present

## 2017-06-23 DIAGNOSIS — L309 Dermatitis, unspecified: Secondary | ICD-10-CM | POA: Diagnosis not present

## 2017-08-03 DIAGNOSIS — Z363 Encounter for antenatal screening for malformations: Secondary | ICD-10-CM | POA: Diagnosis not present

## 2017-08-03 DIAGNOSIS — Z3A19 19 weeks gestation of pregnancy: Secondary | ICD-10-CM | POA: Diagnosis not present

## 2017-09-27 DIAGNOSIS — Z23 Encounter for immunization: Secondary | ICD-10-CM | POA: Diagnosis not present

## 2017-09-27 DIAGNOSIS — Z3480 Encounter for supervision of other normal pregnancy, unspecified trimester: Secondary | ICD-10-CM | POA: Diagnosis not present

## 2017-10-12 DIAGNOSIS — Z23 Encounter for immunization: Secondary | ICD-10-CM | POA: Diagnosis not present

## 2017-11-24 DIAGNOSIS — Z3480 Encounter for supervision of other normal pregnancy, unspecified trimester: Secondary | ICD-10-CM | POA: Diagnosis not present

## 2017-11-24 DIAGNOSIS — Z3685 Encounter for antenatal screening for Streptococcus B: Secondary | ICD-10-CM | POA: Diagnosis not present

## 2017-11-24 LAB — OB RESULTS CONSOLE GBS: GBS: POSITIVE

## 2017-12-14 ENCOUNTER — Encounter (HOSPITAL_COMMUNITY): Payer: Self-pay | Admitting: *Deleted

## 2017-12-14 ENCOUNTER — Telehealth (HOSPITAL_COMMUNITY): Payer: Self-pay | Admitting: *Deleted

## 2017-12-14 NOTE — Telephone Encounter (Signed)
Preadmission screen  

## 2017-12-23 NOTE — H&P (Signed)
Kirsten ReilMary Hunt is a 32 y.o. female presenting for IOL at term. Pregnancy complicated by IVF with normal PGD>declined echo. OB History    Gravida  2   Para  1   Term  1   Preterm      AB      Living  1     SAB      TAB      Ectopic      Multiple  0   Live Births  1          Past Medical History:  Diagnosis Date  . Complication of anesthesia   . History of vertigo   . Hx of varicella   . PONV (postoperative nausea and vomiting)    Past Surgical History:  Procedure Laterality Date  . WISDOM TOOTH EXTRACTION     Family History: family history includes Cancer in her paternal aunt; Hypertension in her paternal grandmother; Marfan syndrome in her maternal grandfather; Migraines in her maternal aunt and mother; Thyroid disease in her maternal aunt and mother. Social History:  reports that she has never smoked. She has never used smokeless tobacco. She reports current alcohol use. She reports that she does not use drugs.     Maternal Diabetes: No Genetic Screening: Normal Maternal Ultrasounds/Referrals: Normal Fetal Ultrasounds or other Referrals:  None Maternal Substance Abuse:  No Significant Maternal Medications:  None Significant Maternal Lab Results:  None Other Comments:  None  ROS History   Last menstrual period 04/06/2017, unknown if currently breastfeeding. Exam Physical Exam  Prenatal labs: ABO, Rh: A/Positive/-- (05/20 0000) Antibody: n (05/20 0000) Rubella: Immune, Immune (05/20 0000) RPR: Nonreactive, Nonreactive (05/20 0000)  HBsAg: Negative, Negative (05/20 0000)  HIV: Non-reactive, Non-reactive (05/20 0000)  GBS:   positive  Cx 3/60/-2/vtx 12/21/17  Assessment/Plan: 32 yo G2P1 at term for IOL GBBS+>atb per protocol   Roselle LocusJames E Sylvia Kondracki II 12/23/2017, 1:58 PM

## 2017-12-26 ENCOUNTER — Encounter (HOSPITAL_COMMUNITY): Payer: Self-pay

## 2017-12-26 ENCOUNTER — Inpatient Hospital Stay (HOSPITAL_COMMUNITY)
Admission: RE | Admit: 2017-12-26 | Payer: BLUE CROSS/BLUE SHIELD | Source: Ambulatory Visit | Admitting: Obstetrics & Gynecology

## 2017-12-26 ENCOUNTER — Inpatient Hospital Stay (HOSPITAL_COMMUNITY): Payer: BLUE CROSS/BLUE SHIELD | Admitting: Anesthesiology

## 2017-12-26 ENCOUNTER — Inpatient Hospital Stay (HOSPITAL_COMMUNITY)
Admission: RE | Admit: 2017-12-26 | Discharge: 2017-12-27 | DRG: 768 | Disposition: A | Discharge status: Home / Self Care | Payer: BLUE CROSS/BLUE SHIELD | Attending: Obstetrics and Gynecology | Admitting: Obstetrics and Gynecology

## 2017-12-26 ENCOUNTER — Other Ambulatory Visit: Payer: Self-pay

## 2017-12-26 DIAGNOSIS — Z3490 Encounter for supervision of normal pregnancy, unspecified, unspecified trimester: Secondary | ICD-10-CM

## 2017-12-26 DIAGNOSIS — Z3483 Encounter for supervision of other normal pregnancy, third trimester: Secondary | ICD-10-CM | POA: Diagnosis not present

## 2017-12-26 DIAGNOSIS — Z3A39 39 weeks gestation of pregnancy: Secondary | ICD-10-CM | POA: Diagnosis not present

## 2017-12-26 DIAGNOSIS — O99824 Streptococcus B carrier state complicating childbirth: Secondary | ICD-10-CM | POA: Diagnosis not present

## 2017-12-26 LAB — TYPE AND SCREEN
ABO/RH(D): A POS
Antibody Screen: NEGATIVE

## 2017-12-26 LAB — CBC
HCT: 40.7 % (ref 36.0–46.0)
Hemoglobin: 13.7 g/dL (ref 12.0–15.0)
MCH: 32.7 pg (ref 26.0–34.0)
MCHC: 33.7 g/dL (ref 30.0–36.0)
MCV: 97.1 fL (ref 80.0–100.0)
Platelets: 172 10*3/uL (ref 150–400)
RBC: 4.19 MIL/uL (ref 3.87–5.11)
RDW: 12.6 % (ref 11.5–15.5)
WBC: 8.9 10*3/uL (ref 4.0–10.5)
nRBC: 0 % (ref 0.0–0.2)

## 2017-12-26 LAB — RPR: RPR Ser Ql: NONREACTIVE

## 2017-12-26 MED ORDER — SODIUM CHLORIDE 0.9 % IV SOLN
5.0000 10*6.[IU] | Freq: Once | INTRAVENOUS | Status: AC
  Administered 2017-12-26: 5 10*6.[IU] via INTRAVENOUS
  Filled 2017-12-26: qty 5

## 2017-12-26 MED ORDER — TERBUTALINE SULFATE 1 MG/ML IJ SOLN
0.2500 mg | Freq: Once | INTRAMUSCULAR | Status: DC | PRN
  Filled 2017-12-26: qty 1

## 2017-12-26 MED ORDER — DIPHENHYDRAMINE HCL 50 MG/ML IJ SOLN: 12.5000 mg | INTRAMUSCULAR | Status: DC | PRN

## 2017-12-26 MED ORDER — LIDOCAINE HCL (PF) 1 % IJ SOLN
INTRAMUSCULAR | Status: DC | PRN
  Administered 2017-12-26: 13 mL via EPIDURAL

## 2017-12-26 MED ORDER — COCONUT OIL OIL: 1.0000 "application " | TOPICAL_OIL | Status: DC | PRN

## 2017-12-26 MED ORDER — EPHEDRINE 5 MG/ML INJ
10.0000 mg | INTRAVENOUS | Status: DC | PRN
  Filled 2017-12-26: qty 2

## 2017-12-26 MED ORDER — TETANUS-DIPHTH-ACELL PERTUSSIS 5-2.5-18.5 LF-MCG/0.5 IM SUSP: 0.5000 mL | Freq: Once | INTRAMUSCULAR | Status: DC

## 2017-12-26 MED ORDER — ACETAMINOPHEN 325 MG PO TABS: 650.0000 mg | ORAL_TABLET | ORAL | Status: DC | PRN

## 2017-12-26 MED ORDER — LACTATED RINGERS IV SOLN
INTRAVENOUS | Status: DC
  Administered 2017-12-26: 08:00:00 via INTRAVENOUS

## 2017-12-26 MED ORDER — FENTANYL 2.5 MCG/ML BUPIVACAINE 1/10 % EPIDURAL INFUSION (WH - ANES)
14.0000 mL/h | INTRAMUSCULAR | Status: DC | PRN
  Administered 2017-12-26: 14 mL/h via EPIDURAL
  Filled 2017-12-26: qty 100

## 2017-12-26 MED ORDER — BUTORPHANOL TARTRATE 1 MG/ML IJ SOLN: 1.0000 mg | INTRAMUSCULAR | Status: DC | PRN

## 2017-12-26 MED ORDER — ONDANSETRON HCL 4 MG PO TABS: 4.0000 mg | ORAL_TABLET | ORAL | Status: DC | PRN

## 2017-12-26 MED ORDER — ONDANSETRON HCL 4 MG/2ML IJ SOLN: 4.0000 mg | INTRAMUSCULAR | Status: DC | PRN

## 2017-12-26 MED ORDER — OXYCODONE HCL 5 MG PO TABS: 5.0000 mg | ORAL_TABLET | ORAL | Status: DC | PRN

## 2017-12-26 MED ORDER — DIBUCAINE 1 % RE OINT: 1.0000 "application " | TOPICAL_OINTMENT | RECTAL | Status: DC | PRN

## 2017-12-26 MED ORDER — ZOLPIDEM TARTRATE 5 MG PO TABS: 5.0000 mg | ORAL_TABLET | Freq: Every evening | ORAL | Status: DC | PRN

## 2017-12-26 MED ORDER — ACETAMINOPHEN 325 MG PO TABS
650.0000 mg | ORAL_TABLET | ORAL | Status: DC | PRN
  Administered 2017-12-27: 650 mg via ORAL

## 2017-12-26 MED ORDER — PHENYLEPHRINE 40 MCG/ML (10ML) SYRINGE FOR IV PUSH (FOR BLOOD PRESSURE SUPPORT)
80.0000 ug | PREFILLED_SYRINGE | INTRAVENOUS | Status: DC | PRN
  Administered 2017-12-26: 80 ug via INTRAVENOUS
  Filled 2017-12-26: qty 10

## 2017-12-26 MED ORDER — SENNOSIDES-DOCUSATE SODIUM 8.6-50 MG PO TABS
2.0000 | ORAL_TABLET | ORAL | Status: DC
  Administered 2017-12-26: 2 via ORAL
  Filled 2017-12-26: qty 2

## 2017-12-26 MED ORDER — OXYTOCIN 40 UNITS IN LACTATED RINGERS INFUSION - SIMPLE MED: 2.5000 [IU]/h | INTRAVENOUS | Status: DC

## 2017-12-26 MED ORDER — MAGNESIUM HYDROXIDE 400 MG/5ML PO SUSP: 30.0000 mL | ORAL | Status: DC | PRN

## 2017-12-26 MED ORDER — DIPHENHYDRAMINE HCL 25 MG PO CAPS: 25.0000 mg | ORAL_CAPSULE | Freq: Four times a day (QID) | ORAL | Status: DC | PRN

## 2017-12-26 MED ORDER — FLEET ENEMA 7-19 GM/118ML RE ENEM: 1.0000 | ENEMA | RECTAL | Status: DC | PRN

## 2017-12-26 MED ORDER — IBUPROFEN 600 MG PO TABS
600.0000 mg | ORAL_TABLET | Freq: Four times a day (QID) | ORAL | Status: DC
  Administered 2017-12-26 – 2017-12-27 (×4): 600 mg via ORAL
  Filled 2017-12-26 (×4): qty 1

## 2017-12-26 MED ORDER — ONDANSETRON HCL 4 MG/2ML IJ SOLN
4.0000 mg | Freq: Four times a day (QID) | INTRAMUSCULAR | Status: DC | PRN
  Administered 2017-12-26: 4 mg via INTRAVENOUS
  Filled 2017-12-26: qty 2

## 2017-12-26 MED ORDER — OXYCODONE-ACETAMINOPHEN 5-325 MG PO TABS: 2.0000 | ORAL_TABLET | ORAL | Status: DC | PRN

## 2017-12-26 MED ORDER — OXYTOCIN 40 UNITS IN LACTATED RINGERS INFUSION - SIMPLE MED
1.0000 m[IU]/min | INTRAVENOUS | Status: DC
  Administered 2017-12-26: 2 m[IU]/min via INTRAVENOUS
  Filled 2017-12-26: qty 1000

## 2017-12-26 MED ORDER — PENICILLIN G 3 MILLION UNITS IVPB - SIMPLE MED
3.0000 10*6.[IU] | INTRAVENOUS | Status: DC
  Administered 2017-12-26: 3 10*6.[IU] via INTRAVENOUS
  Filled 2017-12-26 (×2): qty 100

## 2017-12-26 MED ORDER — POLYETHYLENE GLYCOL 3350 17 G PO PACK
17.0000 g | PACK | Freq: Every day | ORAL | Status: DC
  Administered 2017-12-27: 17 g via ORAL
  Filled 2017-12-26 (×3): qty 1

## 2017-12-26 MED ORDER — LACTATED RINGERS IV SOLN
500.0000 mL | Freq: Once | INTRAVENOUS | Status: AC
  Administered 2017-12-26: 500 mL via INTRAVENOUS

## 2017-12-26 MED ORDER — SOD CITRATE-CITRIC ACID 500-334 MG/5ML PO SOLN: 30.0000 mL | ORAL | Status: DC | PRN

## 2017-12-26 MED ORDER — LEVOTHYROXINE SODIUM 50 MCG PO TABS
50.0000 ug | ORAL_TABLET | Freq: Every day | ORAL | Status: DC
  Filled 2017-12-26 (×2): qty 1

## 2017-12-26 MED ORDER — OXYTOCIN BOLUS FROM INFUSION
500.0000 mL | Freq: Once | INTRAVENOUS | Status: AC
  Administered 2017-12-26: 500 mL via INTRAVENOUS

## 2017-12-26 MED ORDER — OXYCODONE HCL 5 MG PO TABS: 10.0000 mg | ORAL_TABLET | ORAL | Status: DC | PRN

## 2017-12-26 MED ORDER — LACTATED RINGERS IV SOLN: 500.0000 mL | INTRAVENOUS | Status: DC | PRN

## 2017-12-26 MED ORDER — PHENYLEPHRINE 40 MCG/ML (10ML) SYRINGE FOR IV PUSH (FOR BLOOD PRESSURE SUPPORT)
80.0000 ug | PREFILLED_SYRINGE | INTRAVENOUS | Status: DC | PRN
  Filled 2017-12-26 (×2): qty 10

## 2017-12-26 MED ORDER — WITCH HAZEL-GLYCERIN EX PADS: 1.0000 "application " | MEDICATED_PAD | CUTANEOUS | Status: DC | PRN

## 2017-12-26 MED ORDER — PRENATAL MULTIVITAMIN CH
1.0000 | ORAL_TABLET | Freq: Every day | ORAL | Status: DC
  Administered 2017-12-27: 1 via ORAL
  Filled 2017-12-26: qty 1

## 2017-12-26 MED ORDER — SIMETHICONE 80 MG PO CHEW: 80.0000 mg | CHEWABLE_TABLET | ORAL | Status: DC | PRN

## 2017-12-26 MED ORDER — LIDOCAINE HCL (PF) 1 % IJ SOLN
30.0000 mL | INTRAMUSCULAR | Status: DC | PRN
  Filled 2017-12-26: qty 30

## 2017-12-26 MED ORDER — OXYCODONE-ACETAMINOPHEN 5-325 MG PO TABS: 1.0000 | ORAL_TABLET | ORAL | Status: DC | PRN

## 2017-12-26 MED ORDER — BENZOCAINE-MENTHOL 20-0.5 % EX AERO
1.0000 "application " | INHALATION_SPRAY | CUTANEOUS | Status: DC | PRN
  Filled 2017-12-26: qty 56

## 2017-12-26 NOTE — Anesthesia Procedure Notes (Signed)
Epidural Patient location during procedure: OB Start time: 12/26/2017 11:58 AM End time: 12/26/2017 12:09 PM  Staffing Anesthesiologist: Lowella CurbMiller, Jackelyne Sayer Ray, MD Performed: anesthesiologist   Preanesthetic Checklist Completed: patient identified, site marked, surgical consent, pre-op evaluation, timeout performed, IV checked, risks and benefits discussed and monitors and equipment checked  Epidural Patient position: sitting Prep: ChloraPrep Patient monitoring: heart rate, cardiac monitor, continuous pulse ox and blood pressure Approach: midline Location: L2-L3 Injection technique: LOR saline  Needle:  Needle type: Tuohy  Needle gauge: 17 G Needle length: 9 cm Needle insertion depth: 6 cm Catheter type: closed end flexible Catheter size: 20 Guage Catheter at skin depth: 10 cm Test dose: negative  Assessment Events: blood not aspirated, injection not painful, no injection resistance, negative IV test and no paresthesia  Additional Notes Reason for block:procedure for pain

## 2017-12-26 NOTE — Lactation Note (Signed)
This note was copied from a baby'Kirsten chart. Lactation Consultation Note  Patient Name: Kirsten Hunt AOZHY'QToday'Kirsten Date: 12/26/2017    G2P2 baby Kirsten Abigail 7 hours old. Vaginal delivery.  IVF pregnancy.   Mom denies need,  Has formula in room.  Reports she only plans to do some breastfeeding the first few days while she has colostrum and then she is switching to formula.  Mom reports bad experience with first baby.  Never latched.  Pumped for three weeks.  Got mastitis in both breasts. Mom asked the best way to dry her breast milk  up.  Discussed comfortable bra.  Hand express to comfort only. Urged mom to save any milk she does hand express for comfort. Ice/cabbage also.  Did not Review Understanding mother and Baby.  Parents report they are ready to go to sleep.  Did quickly review feeding logs and Breastfeeding Resource List and [facility] handouts.  Maternal Data    Feeding    LATCH Score                   Interventions    Lactation Tools Discussed/Used     Consult Status      Kirsten Hunt [name] 12/26/2017, 9:10 PM

## 2017-12-26 NOTE — Anesthesia Pain Management Evaluation Note (Signed)
  CRNA Pain Management Visit Note  Patient: Kirsten Hunt, 32 y.o., female  "Hello I am a member of the anesthesia team at [facility]. We have an anesthesia team available at all times to provide care throughout the hospital, including epidural management and anesthesia for C-section. I don't know your plan for the delivery whether it a natural birth, water birth, IV sedation, nitrous supplementation, doula or epidural, but we want to meet your pain goals."   1.Was your pain managed to your expectations on prior hospitalizations?   Yes   2.What is your expectation for pain management during this hospitalization?     Labor support without medications  3.How can we help you reach that goal? Plans to try natural but maybe epi  Record the patient's initial score and the patient's pain goal.   Pain: 2  Pain Goal: 6 [facility] wants you to be able to say your pain was always managed very well.  Cleda ClarksBrowder, Kirsten Buch R 12/26/2017

## 2017-12-26 NOTE — Anesthesia Postprocedure Evaluation (Signed)
Anesthesia Post Note  Patient: Julian ReilMary Kocurek  Procedure(s) Performed: AN AD HOC LABOR EPIDURAL     Patient location during evaluation: Mother Baby Anesthesia Type: Epidural Level of consciousness: awake and alert and oriented Pain management: satisfactory to patient Vital Signs Assessment: post-procedure vital signs reviewed and stable Respiratory status: spontaneous breathing and nonlabored ventilation Cardiovascular status: stable Postop Assessment: no headache, no backache, no signs of nausea or vomiting, adequate PO intake, patient able to bend at knees and able to ambulate (patient up walking) Anesthetic complications: no    Last Vitals:  Vitals:   12/26/17 1645 12/26/17 1745  BP: (!) 112/59 110/67  Pulse: 63 63  Resp: 18 20  Temp: 37 C 37.6 C  SpO2: 99% 100%    Last Pain:  Vitals:   12/26/17 1745  TempSrc: Axillary  PainSc: 0-No pain   Pain Goal:                 Kenitha Glendinning

## 2017-12-26 NOTE — Anesthesia Preprocedure Evaluation (Signed)
Anesthesia Evaluation  Patient identified by MRN, date of birth, ID band Patient awake    Reviewed: Allergy & Precautions, NPO status , Patient's Chart, lab work & pertinent test results  History of Anesthesia Complications (+) PONV and history of anesthetic complications  Airway Mallampati: II  TM Distance: >3 FB Neck ROM: Full    Dental no notable dental hx. (+) Teeth Intact, Dental Advisory Given   Pulmonary neg pulmonary ROS,    Pulmonary exam normal breath sounds clear to auscultation       Cardiovascular negative cardio ROS Normal cardiovascular exam Rhythm:Regular Rate:Normal     Neuro/Psych negative neurological ROS  negative psych ROS   GI/Hepatic negative GI ROS, Neg liver ROS,   Endo/Other  negative endocrine ROS  Renal/GU negative Renal ROS  negative genitourinary   Musculoskeletal negative musculoskeletal ROS (+)   Abdominal   Peds negative pediatric ROS (+)  Hematology negative hematology ROS (+)   Anesthesia Other Findings   Reproductive/Obstetrics (+) Pregnancy                             Anesthesia Physical  Anesthesia Plan  ASA: II  Anesthesia Plan: Epidural   Post-op Pain Management:    Induction:   PONV Risk Score and Plan:   Airway Management Planned:   Additional Equipment:   Intra-op Plan:   Post-operative Plan:   Informed Consent: I have reviewed the patients History and Physical, chart, labs and discussed the procedure including the risks, benefits and alternatives for the proposed anesthesia with the patient or authorized representative who has indicated his/her understanding and acceptance.   Dental advisory given  Plan Discussed with: Anesthesiologist  Anesthesia Plan Comments:         Anesthesia Quick Evaluation

## 2017-12-26 NOTE — Progress Notes (Signed)
No changes to H&P per patient history FHT cat one UCs irregular Cx 3/80/-2/vtx Atb infusing D/W patient>pitocin, risks reviewed

## 2017-12-26 NOTE — Progress Notes (Signed)
10 instruments 10 sponges 3 injectables

## 2017-12-26 NOTE — Progress Notes (Signed)
Delivery Note At 1:56 PM a viable female was delivered via Vaginal, Spontaneous (Presentation:ROA ;  ).  APGAR: 9, 9; weight  .   Placenta status:intact , .  Cord:3 vessels  with the following complications: .  Cord pH: pendiing  Anesthesia:  epidural Episiotomy: second degree MLE with fourth degree extension, repaired in layers with 2O chromic for musoca and 2-O rapide for rest. Repaired in layers. Rectum checked>mucosa intact, sphincter repaired. Lacerations:  none Suture Repair: 2.0 chromic vicryl rapide Est. Blood Loss (mL):    Mom to postpartum.  Baby to Couplet care / Skin to Skin.  Kirsten LocusJames E Makaiah Terwilliger Hunt 12/26/2017, 2:27 PM

## 2017-12-27 LAB — CBC
HCT: 33.9 % — ABNORMAL LOW (ref 36.0–46.0)
HEMOGLOBIN: 11.3 g/dL — AB (ref 12.0–15.0)
MCH: 32.3 pg (ref 26.0–34.0)
MCHC: 33.3 g/dL (ref 30.0–36.0)
MCV: 96.9 fL (ref 80.0–100.0)
Platelets: 149 10*3/uL — ABNORMAL LOW (ref 150–400)
RBC: 3.5 MIL/uL — ABNORMAL LOW (ref 3.87–5.11)
RDW: 12.7 % (ref 11.5–15.5)
WBC: 11.2 10*3/uL — ABNORMAL HIGH (ref 4.0–10.5)
nRBC: 0 % (ref 0.0–0.2)

## 2017-12-27 MED ORDER — IBUPROFEN 600 MG PO TABS: 600.0000 mg | ORAL_TABLET | Freq: Four times a day (QID) | ORAL | 1 refills | Status: DC | PRN

## 2017-12-27 MED ORDER — ACETAMINOPHEN 325 MG PO TABS: 650.0000 mg | ORAL_TABLET | Freq: Four times a day (QID) | ORAL | 1 refills | Status: DC | PRN

## 2017-12-27 NOTE — Progress Notes (Signed)
Post Partum Day 1 Subjective: no complaints, up ad lib, voiding, tolerating PO and + flatus  Objective: Blood pressure 100/60, pulse 72, temperature 98.6 F (37 C), temperature source Oral, resp. rate 18, height 5\' 6"  (1.676 m), weight 71.1 kg, last menstrual period 03/23/2017, SpO2 97 %, unknown if currently breastfeeding.  Physical Exam:  General: alert, cooperative and no distress Lochia: appropriate Uterine Fundus: firm Incision: healing well DVT Evaluation: No evidence of DVT seen on physical exam.  Recent Labs    12/26/17 0648 12/27/17 0510  HGB 13.7 11.3*  HCT 40.7 33.9*    Assessment/Plan: Discharge home   LOS: 1 day   Roselle LocusJames E Camarie Mctigue II 12/27/2017, 7:51 AM

## 2017-12-27 NOTE — Discharge Summary (Signed)
Obstetric Discharge Summary Reason for Admission: induction of labor Prenatal Procedures: none Intrapartum Procedures: spontaneous vaginal delivery Postpartum Procedures: none Complications-Operative and Postpartum: none Hemoglobin  Date Value Ref Range Status  12/27/2017 11.3 (L) 12.0 - 15.0 g/dL Final   HCT  Date Value Ref Range Status  12/27/2017 33.9 (L) 36.0 - 46.0 % Final    Physical Exam:  General: alert, cooperative and no distress Lochia: appropriate Uterine Fundus: firm Incision: healing well DVT Evaluation: No evidence of DVT seen on physical exam.  Discharge Diagnoses: Term Pregnancy-delivered  Discharge Information: Date: 12/27/2017 Activity: pelvic rest Diet: routine Medications: PNV, Ibuprofen and Tylenol Condition: stable Instructions: refer to practice specific booklet Discharge to: home   Newborn Data: Live born female  Birth Weight: 8 lb 6.8 oz (3820 g) APGAR: 9, 9  Newborn Delivery   Birth date/time:  12/26/2017 13:56:00 Delivery type:  Vaginal, Spontaneous     Home with mother.  Roselle LocusJames E Alondra Vandeven II 12/27/2017, 7:56 AM

## 2018-01-03 ENCOUNTER — Inpatient Hospital Stay (HOSPITAL_COMMUNITY)
Admission: AD | Admit: 2018-01-03 | Payer: BLUE CROSS/BLUE SHIELD | Source: Home / Self Care | Admitting: Obstetrics & Gynecology

## 2018-02-08 DIAGNOSIS — Z1389 Encounter for screening for other disorder: Secondary | ICD-10-CM | POA: Diagnosis not present

## 2018-08-02 ENCOUNTER — Telehealth: Payer: Self-pay

## 2018-08-02 NOTE — Telephone Encounter (Signed)
No, patient has not been seen since 2018. Will need evaluation. Can go to [facility] if so bad that she cannot wait or can offer acute appointment with another provider.

## 2018-08-02 NOTE — Telephone Encounter (Signed)
Patient is having back pain she has taken Aleve,Tylenol is not working.Requesting Rx until she is able to come to her appt  Scheduled July,31/20. Please advise

## 2018-08-04 ENCOUNTER — Encounter: Payer: Self-pay | Admitting: Physician Assistant

## 2018-08-04 ENCOUNTER — Other Ambulatory Visit: Payer: Self-pay

## 2018-08-04 ENCOUNTER — Ambulatory Visit: Payer: BC Managed Care – PPO | Admitting: Physician Assistant

## 2018-08-04 VITALS — BP 110/80 | HR 89 | Temp 98.2°F | Ht 66.0 in | Wt 159.0 lb

## 2018-08-04 DIAGNOSIS — M545 Low back pain, unspecified: Secondary | ICD-10-CM

## 2018-08-04 NOTE — Patient Instructions (Signed)
It was great to see you!  Please do the exercises as molly prescribed.  Please be safe while you travel.  Take care,  Inda Coke PA-C

## 2018-08-04 NOTE — Progress Notes (Signed)
Julian ReilMary Shehan is a 33 y.o. female here for a new problem.  I acted as a Neurosurgeonscribe for [employer] East CorporationSamantha Aritza Brunet, PA-C Corky Mullonna Orphanos, LPN  History of Present Illness:   Chief Complaint  Patient presents with  . Back Pain    HPI   Back pain Pt c/o back pain x 2 days. She states that she threw her back out when she was bending down to shave her legs. She says ever since she had her last child she has done this 3 x's in the past 5 months. It always occurs with forward flexion. Pt has been using ice, heat, rest and Aleve with relief.  Denies: prior back injury or trauma, current exercise regimen (used to be very active prior to pregnancy), numbness/tingling, urinary issues   Past Medical History:  Diagnosis Date  . Complication of anesthesia   . History of vertigo   . Hx of varicella   . PONV (postoperative nausea and vomiting)      Social History   Socioeconomic History  . Marital status: Married    Spouse name: Not on file  . Number of children: Not on file  . Years of education: Not on file  . Highest education level: Not on file  Occupational History  . Not on file  Social Needs  . Financial resource strain: Not on file  . Food insecurity    Worry: Not on file    Inability: Not on file  . Transportation needs    Medical: Not on file    Non-medical: Not on file  Tobacco Use  . Smoking status: Never Smoker  . Smokeless tobacco: Never Used  Substance and Sexual Activity  . Alcohol use: Yes    Comment: Occasional  . Drug use: No  . Sexual activity: Yes  Lifestyle  . Physical activity    Days per week: Not on file    Minutes per session: Not on file  . Stress: Not on file  Relationships  . Social Musicianconnections    Talks on phone: Not on file    Gets together: Not on file    Attends religious service: Not on file    Active member of club or organization: Not on file    Attends meetings of clubs or organizations: Not on file    Relationship status: Not on file  . Intimate  partner violence    Fear of current or ex partner: Not on file    Emotionally abused: Not on file    Physically abused: Not on file    Forced sexual activity: Not on file  Other Topics Concern  . Not on file  Social History Narrative  . Not on file    Past Surgical History:  Procedure Laterality Date  . WISDOM TOOTH EXTRACTION      Family History  Problem Relation Age of Onset  . Thyroid disease Mother   . Migraines Mother   . Thyroid disease Maternal Aunt   . Migraines Maternal Aunt   . Cancer Paternal Aunt   . Hypertension Paternal Grandmother   . Marfan syndrome Maternal Grandfather     No Known Allergies  Current Medications:   Current Outpatient Medications:  .  ibuprofen (ADVIL,MOTRIN) 600 MG tablet, Take 1 tablet (600 mg total) by mouth every 6 (six) hours as needed., Disp: 90 tablet, Rfl: 1 .  levonorgestrel-ethinyl estradiol (JOLESSA) 0.15-0.03 MG tablet, Jolessa 0.15 mg-30 mcg (91) tablets,3 month dose pack  Take 1 tablet every day by oral  route., Disp: , Rfl:  .  naproxen sodium (ALEVE) 220 MG tablet, Take 220 mg by mouth 2 (two) times daily as needed., Disp: , Rfl:    Review of Systems:   ROS Negative unless otherwise specified per HPI.  Vitals:   Vitals:   08/04/18 0820  BP: 110/80  Pulse: 89  Temp: 98.2 F (36.8 C)  TempSrc: Temporal  SpO2: 99%  Weight: 159 lb (72.1 kg)  Height: 5\' 6"  (1.676 m)     Body mass index is 25.66 kg/m.  Physical Exam:   Physical Exam Constitutional:      Appearance: She is well-developed.  HENT:     Head: Normocephalic and atraumatic.  Eyes:     Conjunctiva/sclera: Conjunctivae normal.  Neck:     Musculoskeletal: Normal range of motion and neck supple.  Pulmonary:     Effort: Pulmonary effort is normal.  Musculoskeletal: Normal range of motion.     Comments: Decreased ROM 2/2 pain with back flexion. No decreased ROM with extension, lateral side bends, or rotation. No reproducible tenderness with deep  palpation to bilateral paraspinal muscles. No bony tenderness. No evidence of erythema, rash or ecchymosis. Negative STLR bilaterally.   Skin:    General: Skin is warm and dry.  Neurological:     Mental Status: She is alert and oriented to person, place, and time.  Psychiatric:        Behavior: Behavior normal.        Thought Content: Thought content normal.        Judgment: Judgment normal.      Assessment and Plan:   Yosselin was seen today for back pain.  Diagnoses and all orders for this visit:  Acute right-sided low back pain without sciatica -     Ambulatory referral to Sports Medicine   Patient also evaluated by Wendy Poet, LAT. Suspect SI joint pain/dysfunction. Declines medication at this time. Was given extensive number of exercises by [facility] to work on. Appointment made with Charlann Boxer for possible OMT. Worsening precautions advised.  . Reviewed expectations re: course of current medical issues. . Discussed self-management of symptoms. . Outlined signs and symptoms indicating need for more acute intervention. . Patient verbalized understanding and all questions were answered. . See orders for this visit as documented in the electronic medical record. . Patient received an After-Visit Summary.  CMA or LPN served as scribe during this visit. History, Physical, and Plan performed by medical provider. The above documentation has been reviewed and is accurate and complete.  Inda Coke, PA-C

## 2018-08-04 NOTE — Telephone Encounter (Signed)
Noted has appt scheduled.

## 2018-09-05 ENCOUNTER — Ambulatory Visit: Payer: BC Managed Care – PPO | Admitting: Family Medicine

## 2018-09-05 ENCOUNTER — Encounter: Payer: Self-pay | Admitting: Family Medicine

## 2018-09-05 DIAGNOSIS — M533 Sacrococcygeal disorders, not elsewhere classified: Secondary | ICD-10-CM

## 2018-09-05 DIAGNOSIS — M999 Biomechanical lesion, unspecified: Secondary | ICD-10-CM | POA: Insufficient documentation

## 2018-09-05 NOTE — Assessment & Plan Note (Signed)
Patient is more of a sacroiliac dysfunction.  Discussed which activities of doing which wants to avoid.  Discussed posture dynamics, discussed which activities to avoid.  Patient is to increase activity slowly, and follow-up with me again in 4 to 6 weeks.

## 2018-09-05 NOTE — Assessment & Plan Note (Signed)
Decision today to treat with OMT was based on Physical Exam  After verbal consent patient was treated with HVLA, ME, FPR techniques in cervical, thoracic, lumbar and sacral areas  Patient tolerated the procedure well with improvement in symptoms  Patient given exercises, stretches and lifestyle modifications  See medications in patient instructions if given  Patient will follow up in 4-6 weeks 

## 2018-09-05 NOTE — Progress Notes (Signed)
Tawana ScaleZach  D.O. [facility] [address] [place], [place] [address] Phone: [phone] Subjective:    I'm seeing this patient by the request  of:  samatha worley PA  CC: Low back pain  BJY:NWGNFAOZHYHPI:Subjective  Kirsten ReilMary Hunt is a 33 y.o. female coming in with complaint of right sided lower back pain. Back has not been the same since second child in December. Pain intermittent with lumbar flexion. No pain today. Does feel tightness. Does not stretch. Denies any radiating symptoms. Does use Tylenol, ice, and heat prn.   Rates the severity of pain is 3 out of 10.     Past Medical History:  Diagnosis Date  . Complication of anesthesia   . History of vertigo   . Hx of varicella   . PONV (postoperative nausea and vomiting)    Past Surgical History:  Procedure Laterality Date  . WISDOM TOOTH EXTRACTION     Social History   Socioeconomic History  . Marital status: Married    Spouse name: Not on file  . Number of children: Not on file  . Years of education: Not on file  . Highest education level: Not on file  Occupational History  . Not on file  Social Needs  . Financial resource strain: Not on file  . Food insecurity    Worry: Not on file    Inability: Not on file  . Transportation needs    Medical: Not on file    Non-medical: Not on file  Tobacco Use  . Smoking status: Never Smoker  . Smokeless tobacco: Never Used  Substance and Sexual Activity  . Alcohol use: Yes    Comment: Occasional  . Drug use: No  . Sexual activity: Yes  Lifestyle  . Physical activity    Days per week: Not on file    Minutes per session: Not on file  . Stress: Not on file  Relationships  . Social Musicianconnections    Talks on phone: Not on file    Gets together: Not on file    Attends religious service: Not on file    Active member of club or organization: Not on file    Attends meetings of clubs or organizations: Not on file    Relationship status: Not on file  Other Topics Concern  .  Not on file  Social History Narrative  . Not on file   No Known Allergies Family History  Problem Relation Age of Onset  . Thyroid disease Mother   . Migraines Mother   . Thyroid disease Maternal Aunt   . Migraines Maternal Aunt   . Cancer Paternal Aunt   . Hypertension Paternal Grandmother   . Marfan syndrome Maternal Grandfather     Current Outpatient Medications (Endocrine & Metabolic):  .  levonorgestrel-ethinyl estradiol (JOLESSA) 0.15-0.03 MG tablet, Jolessa 0.15 mg-30 mcg (91) tablets,3 month dose pack  Take 1 tablet every day by oral route.    Current Outpatient Medications (Analgesics):  .  ibuprofen (ADVIL,MOTRIN) 600 MG tablet, Take 1 tablet (600 mg total) by mouth every 6 (six) hours as needed. .  naproxen sodium (ALEVE) 220 MG tablet, Take 220 mg by mouth 2 (two) times daily as needed.      Past medical history, social, surgical and family history all reviewed in electronic medical record.  No pertanent information unless stated regarding to the chief complaint.   Review of Systems:  No headache, visual changes, nausea, vomiting, diarrhea, constipation, dizziness, abdominal pain, skin rash,  fevers, chills, night sweats, weight loss, swollen lymph nodes, body aches, joint swelling, chest pain, shortness of breath, mood changes.  Positive muscle aches  Objective  Blood pressure 122/82, pulse 70, height 5\' 6"  (1.676 m), weight 158 lb (71.7 kg), SpO2 99 %, not currently breastfeeding.    General: No apparent distress alert and oriented x3 mood and affect normal, dressed appropriately.  HEENT: Pupils equal, extraocular movements intact  Respiratory: Patient's speak in full sentences and does not appear short of breath  Cardiovascular: No lower extremity edema, non tender, no erythema  Skin: Warm dry intact with no signs of infection or rash on extremities or on axial skeleton.  Abdomen: Soft nontender  Neuro: Cranial nerves II through XII are intact,  neurovascularly intact in all extremities with 2+ DTRs and 2+ pulses.  Lymph: No lymphadenopathy of posterior or anterior cervical chain or axillae bilaterally.  Gait normal with good balance and coordination.  MSK:  Non tender with full range of motion and good stability and symmetric strength and tone of shoulders, elbows, wrist, hip, knee and ankles bilaterally.  Back Exam:  Inspection: Mild loss of lordosis Motion: Flexion 45 deg, Extension 25 deg, Side Bending to 45 deg bilaterally,  Rotation to 45 deg bilaterally  SLR laying: Negative  XSLR laying: Negative  Palpable tenderness: Tender to palpation in the posterior lumbar spine negative. FABER: Tightness of the right. Sensory change: Gross sensation intact to all lumbar and sacral dermatomes.  Reflexes: 2+ at both patellar tendons, 2+ at achilles tendons, Babinski's downgoing.  Strength at foot  Plantar-flexion: 5/5 Dorsi-flexion: 5/5 Eversion: 5/5 Inversion: 5/5  Leg strength  Quad: 5/5 Hamstring: 5/5 Hip flexor: 5/5 Hip abductors: 5/5     Osteopathic findings  C2 flexed rotated and side bent right C4 flexed rotated and side bent left T9 extended rotated and side bent left L1 flexed rotated and side bent right Sacrum right on right   [id number]; 15 additional minutes spent for Therapeutic exercises as stated in above notes.  This included exercises focusing on stretching, strengthening, with significant focus on eccentric aspects.   Long term goals include an improvement in range of motion, strength, endurance as well as avoiding reinjury. Patient's frequency would include in 1-2 times a day, 3-5 times a week for a duration of 6-12 weeks. Sacroiliac Joint Mobilization and Rehab 1. Work on pretzel stretching, shoulder back and leg draped in front. 3-5 sets, 30 sec.. 2. hip abductor rotations. standing, hip flexion and rotation outward then inward. 3 sets, 15 reps. when can do comfortably, add ankle weights starting at 2 pounds.  3.  cross over stretching - shoulder back to ground, same side leg crossover. 3-5 sets for 30 min..  4. rolling up and back knees to chest and rocking. 5. sacral tilt - 5 sets, hold for 5-10 seconds   Proper technique shown and discussed handout in great detail with ATC.  All questions were discussed and answered.     Impression and Recommendations:     This case required medical decision making of moderate complexity. The above documentation has been reviewed and is accurate and complete Lyndal Pulley, DO       Note: This dictation was prepared with Dragon dictation along with smaller phrase technology. Any transcriptional errors that result from this process are unintentional.

## 2018-09-05 NOTE — Patient Instructions (Signed)
Good to see you.  Ice 20 minutes 2 times daily. Usually after activity and before bed. Exercises 3 times a week.  Turmeric 500mg daily  Tart cherry extract 1200mg at night Vitamin D 2000 IU daily  See me again in 4-6 weeks 

## 2018-10-05 ENCOUNTER — Ambulatory Visit: Payer: BC Managed Care – PPO | Admitting: Family Medicine

## 2018-10-05 ENCOUNTER — Encounter: Payer: Self-pay | Admitting: Family Medicine

## 2018-10-05 ENCOUNTER — Other Ambulatory Visit: Payer: Self-pay

## 2018-10-05 VITALS — BP 100/64 | HR 87 | Ht 66.0 in | Wt 157.0 lb

## 2018-10-05 DIAGNOSIS — M999 Biomechanical lesion, unspecified: Secondary | ICD-10-CM

## 2018-10-05 DIAGNOSIS — M533 Sacrococcygeal disorders, not elsewhere classified: Secondary | ICD-10-CM | POA: Diagnosis not present

## 2018-10-05 NOTE — Assessment & Plan Note (Signed)
Patient is responding fairly well to osteopathic manipulation.  Patient is to do home exercises on a more regular basis but only 3 times a week.  Patient has not been doing them quite as frequently.  Discussed exercises, which activities to do which wants to avoid.

## 2018-10-05 NOTE — Patient Instructions (Signed)
Good to see you See you again in 4-5 weeks

## 2018-10-05 NOTE — Progress Notes (Signed)
[name] [address] [place], [place] [address] Phone: [phone] Subjective:   I Ronelle Nigh am serving as a Neurosurgeon for Dr. Antoine Primas.   CC: Back pain follow-up  [id number]  Kirsten Hunt is a 33 y.o. female coming in with complaint of SI joint pain. States her pain has come back.  Patient states she is intermittently been doing the exercises.  States that overall those things that she is getting better but it is slow.  States for 3 weeks was feeling significantly better in the last week started having increasing discomfort and pain again.      Past Medical History:  Diagnosis Date  . Complication of anesthesia   . History of vertigo   . Hx of varicella   . PONV (postoperative nausea and vomiting)    Past Surgical History:  Procedure Laterality Date  . WISDOM TOOTH EXTRACTION     Social History   Socioeconomic History  . Marital status: Married    Spouse name: Not on file  . Number of children: Not on file  . Years of education: Not on file  . Highest education level: Not on file  Occupational History  . Not on file  Social Needs  . Financial resource strain: Not on file  . Food insecurity    Worry: Not on file    Inability: Not on file  . Transportation needs    Medical: Not on file    Non-medical: Not on file  Tobacco Use  . Smoking status: Never Smoker  . Smokeless tobacco: Never Used  Substance and Sexual Activity  . Alcohol use: Yes    Comment: Occasional  . Drug use: No  . Sexual activity: Yes  Lifestyle  . Physical activity    Days per week: Not on file    Minutes per session: Not on file  . Stress: Not on file  Relationships  . Social Musician on phone: Not on file    Gets together: Not on file    Attends religious service: Not on file    Active member of club or organization: Not on file    Attends meetings of clubs or organizations: Not on file    Relationship status: Not on file   Other Topics Concern  . Not on file  Social History Narrative  . Not on file   No Known Allergies Family History  Problem Relation Age of Onset  . Thyroid disease Mother   . Migraines Mother   . Thyroid disease Maternal Aunt   . Migraines Maternal Aunt   . Cancer Paternal Aunt   . Hypertension Paternal Grandmother   . Marfan syndrome Maternal Grandfather     Current Outpatient Medications (Endocrine & Metabolic):  .  levonorgestrel-ethinyl estradiol (JOLESSA) 0.15-0.03 MG tablet, Jolessa 0.15 mg-30 mcg (91) tablets,3 month dose pack  Take 1 tablet every day by oral route.    Current Outpatient Medications (Analgesics):  .  ibuprofen (ADVIL,MOTRIN) 600 MG tablet, Take 1 tablet (600 mg total) by mouth every 6 (six) hours as needed. .  naproxen sodium (ALEVE) 220 MG tablet, Take 220 mg by mouth 2 (two) times daily as needed.      Past medical history, social, surgical and family history all reviewed in electronic medical record.  No pertanent information unless stated regarding to the chief complaint.   Review of Systems:  No headache, visual changes, nausea, vomiting, diarrhea, constipation, dizziness, abdominal pain, skin  rash, fevers, chills, night sweats, weight loss, swollen lymph nodes, body aches, joint swelling, muscle aches, chest pain, shortness of breath, mood changes.   Objective  not currently breastfeeding. Systems examined below as of    General: No apparent distress alert and oriented x3 mood and affect normal, dressed appropriately.  HEENT: Pupils equal, extraocular movements intact  Respiratory: Patient's speak in full sentences and does not appear short of breath  Cardiovascular: No lower extremity edema, non tender, no erythema  Skin: Warm dry intact with no signs of infection or rash on extremities or on axial skeleton.  Abdomen: Soft nontender  Neuro: Cranial nerves II through XII are intact, neurovascularly intact in all extremities with 2+ DTRs and  2+ pulses.  Lymph: No lymphadenopathy of posterior or anterior cervical chain or axillae bilaterally.  Gait normal with good balance and coordination.  MSK:  Non tender with full range of motion and good stability and symmetric strength and tone of shoulders, elbows, wrist, hip, knee and ankles bilaterally.  Back exam has some mild loss of lordosis of the lumbar spine but no scoliosis noted.  Severe tenderness to palpation of the right sacroiliac joint.  Positive Corky Sox.  Negative straight leg test.  No midline tenderness.  5 out of 5 strength of the lower extremities  Osteopathic findings T9 extended rotated and side bent left L2 flexed rotated and side bent right Sacrum right on right    Impression and Recommendations:     This case required medical decision making of moderate complexity. The above documentation has been reviewed and is accurate and complete Lyndal Pulley, DO       Note: This dictation was prepared with Dragon dictation along with smaller phrase technology. Any transcriptional errors that result from this process are unintentional.

## 2018-10-05 NOTE — Assessment & Plan Note (Signed)
Decision today to treat with OMT was based on Physical Exam  After verbal consent patient was treated with HVLA, ME, FPR techniques in  thoracic, lumbar and sacral areas  Patient tolerated the procedure well with improvement in symptoms  Patient given exercises, stretches and lifestyle modifications  See medications in patient instructions if given  Patient will follow up in 4-8 weeks 

## 2018-10-12 ENCOUNTER — Ambulatory Visit (INDEPENDENT_AMBULATORY_CARE_PROVIDER_SITE_OTHER): Payer: BC Managed Care – PPO

## 2018-10-12 ENCOUNTER — Encounter: Payer: Self-pay | Admitting: Family Medicine

## 2018-10-12 ENCOUNTER — Other Ambulatory Visit: Payer: Self-pay

## 2018-10-12 DIAGNOSIS — Z23 Encounter for immunization: Secondary | ICD-10-CM

## 2018-11-10 ENCOUNTER — Encounter: Payer: Self-pay | Admitting: Family Medicine

## 2018-11-10 ENCOUNTER — Other Ambulatory Visit: Payer: Self-pay

## 2018-11-10 ENCOUNTER — Ambulatory Visit: Payer: BC Managed Care – PPO | Admitting: Family Medicine

## 2018-11-10 VITALS — BP 106/70 | HR 110 | Ht 66.0 in | Wt 156.0 lb

## 2018-11-10 DIAGNOSIS — M533 Sacrococcygeal disorders, not elsewhere classified: Secondary | ICD-10-CM

## 2018-11-10 DIAGNOSIS — M999 Biomechanical lesion, unspecified: Secondary | ICD-10-CM | POA: Diagnosis not present

## 2018-11-10 NOTE — Assessment & Plan Note (Signed)
Decision today to treat with OMT was based on Physical Exam  After verbal consent patient was treated with HVLA, ME, FPR techniques in  thoracic, lumbar and sacral areas  Patient tolerated the procedure well with improvement in symptoms  Patient given exercises, stretches and lifestyle modifications  See medications in patient instructions if given  Patient will follow up in 4-8 weeks 

## 2018-11-10 NOTE — Assessment & Plan Note (Signed)
Patient responding fairly well to osteopathic manipulation, pain was more on the left side at this moment.  No radicular symptoms.  Discussed which activities to doing which wants to avoid.  Patient is to increase activity as tolerated. RTC in 4 weeks

## 2018-11-10 NOTE — Progress Notes (Signed)
[name] [address] [place], [place] [address] Phone: [phone] Subjective:   Bruce Donath, am serving as a scribe for Dr. Antoine Primas. I'm seeing this patient by the request  of:    CC: Low back pain follow-up  [id number]  Kirsten Hunt is a 33 y.o. female coming in with complaint of low back and SI joint pain. Last seen on 10/05/2018 for OMT. Patient states that she only had 2-3 weeks of relief.       Past Medical History:  Diagnosis Date  . Complication of anesthesia   . History of vertigo   . Hx of varicella   . PONV (postoperative nausea and vomiting)    Past Surgical History:  Procedure Laterality Date  . WISDOM TOOTH EXTRACTION     Social History   Socioeconomic History  . Marital status: Married    Spouse name: Not on file  . Number of children: Not on file  . Years of education: Not on file  . Highest education level: Not on file  Occupational History  . Not on file  Social Needs  . Financial resource strain: Not on file  . Food insecurity    Worry: Not on file    Inability: Not on file  . Transportation needs    Medical: Not on file    Non-medical: Not on file  Tobacco Use  . Smoking status: Never Smoker  . Smokeless tobacco: Never Used  Substance and Sexual Activity  . Alcohol use: Yes    Comment: Occasional  . Drug use: No  . Sexual activity: Yes  Lifestyle  . Physical activity    Days per week: Not on file    Minutes per session: Not on file  . Stress: Not on file  Relationships  . Social Musician on phone: Not on file    Gets together: Not on file    Attends religious service: Not on file    Active member of club or organization: Not on file    Attends meetings of clubs or organizations: Not on file    Relationship status: Not on file  Other Topics Concern  . Not on file  Social History Narrative  . Not on file   No Known Allergies Family History  Problem Relation Age of Onset   . Thyroid disease Mother   . Migraines Mother   . Thyroid disease Maternal Aunt   . Migraines Maternal Aunt   . Cancer Paternal Aunt   . Hypertension Paternal Grandmother   . Marfan syndrome Maternal Grandfather     Current Outpatient Medications (Endocrine & Metabolic):  .  levonorgestrel-ethinyl estradiol (JOLESSA) 0.15-0.03 MG tablet, Jolessa 0.15 mg-30 mcg (91) tablets,3 month dose pack  Take 1 tablet every day by oral route.    Current Outpatient Medications (Analgesics):  .  ibuprofen (ADVIL,MOTRIN) 600 MG tablet, Take 1 tablet (600 mg total) by mouth every 6 (six) hours as needed. .  naproxen sodium (ALEVE) 220 MG tablet, Take 220 mg by mouth 2 (two) times daily as needed.      Past medical history, social, surgical and family history all reviewed in electronic medical record.  No pertanent information unless stated regarding to the chief complaint.   Review of Systems:  No headache, visual changes, nausea, vomiting, diarrhea, constipation, dizziness, abdominal pain, skin rash, fevers, chills, night sweats, weight loss, swollen lymph nodes, body aches, joint swelling, chest pain, shortness of breath, mood  changes.  Positive muscle aches  Objective  Blood pressure 106/70, pulse (!) 110, height 5\' 6"  (1.676 m), weight 156 lb (70.8 kg), SpO2 97 %, not currently breastfeeding.    General: No apparent distress alert and oriented x3 mood and affect normal, dressed appropriately.  HEENT: Pupils equal, extraocular movements intact  Respiratory: Patient's speak in full sentences and does not appear short of breath  Cardiovascular: Trace lower extremity edema, non tender, no erythema  Skin: Warm dry intact with no signs of infection or rash on extremities or on axial skeleton.  Abdomen: Soft nontender  Neuro: Cranial nerves II through XII are intact, neurovascularly intact in all extremities with 2+ DTRs and 2+ pulses.  Lymph: No lymphadenopathy of posterior or anterior cervical  chain or axillae bilaterally.  Gait normal with good balance and coordination.  MSK:  Non tender with full range of motion and good stability and symmetric strength and tone of shoulders, elbows, wrist, hip, knee and ankles bilaterally.  Lower back exam shows the patient does have tenderness around the left sacroiliac joint.  Patient has a positive Corky Sox on the left.  Negative straight leg test.  5 out of 5 strength in lower extremities deep tendon reflexes intact  Osteopathic findings   T3 extended rotated and side bent right inhaled third rib T5 extended rotated and side bent left L1 flexed rotated and side bent right Sacrum left on left       Impression and Recommendations:     This case required medical decision making of moderate complexity. The above documentation has been reviewed and is accurate and complete Lyndal Pulley, DO       Note: This dictation was prepared with Dragon dictation along with smaller phrase technology. Any transcriptional errors that result from this process are unintentional.

## 2018-12-06 ENCOUNTER — Other Ambulatory Visit: Payer: Self-pay

## 2018-12-06 ENCOUNTER — Ambulatory Visit: Payer: BC Managed Care – PPO | Admitting: Family Medicine

## 2018-12-06 ENCOUNTER — Encounter: Payer: Self-pay | Admitting: Family Medicine

## 2018-12-06 VITALS — BP 106/68 | HR 84 | Ht 66.0 in | Wt 157.0 lb

## 2018-12-06 DIAGNOSIS — M533 Sacrococcygeal disorders, not elsewhere classified: Secondary | ICD-10-CM | POA: Diagnosis not present

## 2018-12-06 DIAGNOSIS — M999 Biomechanical lesion, unspecified: Secondary | ICD-10-CM

## 2018-12-06 NOTE — Progress Notes (Signed)
[facility] [address] [place], [place] [address] Phone: [phone] Subjective:   Fontaine No, am serving as a scribe for Dr. Hulan Saas.  This visit occurred during the SARS-CoV-2 public health emergency.  Safety protocols were in place, including screening questions prior to the visit, additional usage of staff PPE, and extensive cleaning of exam room while observing appropriate contact time as indicated for disinfecting solutions.    CC: Patient does have back pain follow-up  [id number]  Kirsten Hunt is a 33 y.o. female coming in with complaint of back pain. Last seen on 11/10/2018 for OMT. Patient states that she is doing well. She has been running and not having any issues.  Patient is increasing her activity, and continues to do the exercises fairly regularly and is noticing improvement.     Past Medical History:  Diagnosis Date  . Complication of anesthesia   . History of vertigo   . Hx of varicella   . PONV (postoperative nausea and vomiting)    Past Surgical History:  Procedure Laterality Date  . WISDOM TOOTH EXTRACTION     Social History   Socioeconomic History  . Marital status: Married    Spouse name: Not on file  . Number of children: Not on file  . Years of education: Not on file  . Highest education level: Not on file  Occupational History  . Not on file  Social Needs  . Financial resource strain: Not on file  . Food insecurity    Worry: Not on file    Inability: Not on file  . Transportation needs    Medical: Not on file    Non-medical: Not on file  Tobacco Use  . Smoking status: Never Smoker  . Smokeless tobacco: Never Used  Substance and Sexual Activity  . Alcohol use: Yes    Comment: Occasional  . Drug use: No  . Sexual activity: Yes  Lifestyle  . Physical activity    Days per week: Not on file    Minutes per session: Not on file  . Stress: Not on file  Relationships  . Social Clinical research associate on phone: Not on file    Gets together: Not on file    Attends religious service: Not on file    Active member of club or organization: Not on file    Attends meetings of clubs or organizations: Not on file    Relationship status: Not on file  Other Topics Concern  . Not on file  Social History Narrative  . Not on file   No Known Allergies Family History  Problem Relation Age of Onset  . Thyroid disease Mother   . Migraines Mother   . Thyroid disease Maternal Aunt   . Migraines Maternal Aunt   . Cancer Paternal Aunt   . Hypertension Paternal Grandmother   . Marfan syndrome Maternal Grandfather     Current Outpatient Medications (Endocrine & Metabolic):  .  levonorgestrel-ethinyl estradiol (JOLESSA) 0.15-0.03 MG tablet, Jolessa 0.15 mg-30 mcg (91) tablets,3 month dose pack  Take 1 tablet every day by oral route.    Current Outpatient Medications (Analgesics):  .  ibuprofen (ADVIL,MOTRIN) 600 MG tablet, Take 1 tablet (600 mg total) by mouth every 6 (six) hours as needed. .  naproxen sodium (ALEVE) 220 MG tablet, Take 220 mg by mouth 2 (two) times daily as needed.      Past medical history, social, surgical and family history all reviewed  in electronic medical record.  No pertanent information unless stated regarding to the chief complaint.   Review of Systems:  No headache, visual changes, nausea, vomiting, diarrhea, constipation, dizziness, abdominal pain, skin rash, fevers, chills, night sweats, weight loss, swollen lymph nodes, body aches, joint swelling,  chest pain, shortness of breath, mood changes.  Positive muscle aches  Objective  Blood pressure 106/68, pulse 84, height 5\' 6"  (1.676 m), weight 157 lb (71.2 kg), SpO2 98 %, not currently breastfeeding.    General: No apparent distress alert and oriented x3 mood and affect normal, dressed appropriately.  HEENT: Pupils equal, extraocular movements intact  Respiratory: Patient's speak in full sentences and does  not appear short of breath  Cardiovascular: No lower extremity edema, non tender, no erythema  Skin: Warm dry intact with no signs of infection or rash on extremities or on axial skeleton.  Abdomen: Soft nontender  Neuro: Cranial nerves II through XII are intact, neurovascularly intact in all extremities with 2+ DTRs and 2+ pulses.  Lymph: No lymphadenopathy of posterior or anterior cervical chain or axillae bilaterally.  Gait normal with good balance and coordination.  MSK:  Non tender with full range of motion and good stability and symmetric strength and tone of shoulders, elbows, wrist, hip, knee and ankles bilaterally.  Back exam still has significant tightness in the paraspinal musculature lumbar spine.  Positive FABER test right greater than left but both sides seem to be somewhat tight.  Negative straight leg test.  Mild increase in discomfort with extension of the back.  Neurovascularly intact distally with 5 out of 5 strength.  Osteopathic findings T11 extended rotated and side bent left L1 flexed rotated and side bent right L3 flexed rotated and side bent left Sacrum right on right     Impression and Recommendations:     This case required medical decision making of moderate complexity. The above documentation has been reviewed and is accurate and complete , DO       Note: This dictation was prepared with Dragon dictation along with smaller phrase technology. Any transcriptional errors that result from this process are unintentional.

## 2018-12-06 NOTE — Patient Instructions (Addendum)
  Stretch after working out Eat within 45 minutes of ending workout See me in 6-8 weeks   [address] [place] [address] [place], [place] [address] Phone [phone]

## 2018-12-06 NOTE — Assessment & Plan Note (Signed)
Patient is started running a more regular basis.  We discussed hip flexor stretches that I think will be more beneficial as well as the range of motion exercises still for the sacroiliac joint.  Discussed with patient about icing regimen, home exercises, topical anti-inflammatories.  Patient will continue with the vitamin supplementations.  Follow-up again 6 to 8 weeks

## 2018-12-06 NOTE — Assessment & Plan Note (Signed)
Decision today to treat with OMT was based on Physical Exam  After verbal consent patient was treated with HVLA, ME, FPR techniques in  thoracic, lumbar and sacral areas  Patient tolerated the procedure well with improvement in symptoms  Patient given exercises, stretches and lifestyle modifications  See medications in patient instructions if given  Patient will follow up in  6-8 weeks 

## 2019-01-17 ENCOUNTER — Ambulatory Visit: Payer: BC Managed Care – PPO | Admitting: Family Medicine

## 2019-02-23 ENCOUNTER — Ambulatory Visit: Payer: BC Managed Care – PPO | Admitting: Family Medicine

## 2019-02-25 NOTE — Progress Notes (Signed)
[name] [address] [place] [address] Phone: [phone] Subjective:   I Ronelle Nigh am serving as a Neurosurgeon for Dr. Antoine Primas.  This visit occurred during the SARS-CoV-2 public health emergency.  Safety protocols were in place, including screening questions prior to the visit, additional usage of staff PPE, and extensive cleaning of exam room while observing appropriate contact time as indicated for disinfecting solutions.   I'm seeing this patient by the request  of:  Patient, No Pcp Per  CC: Low back pain follow-up  JQZ:[id number]  Kirsten Hunt is a 34 y.o. female coming in with complaint of back pain. Last seen on 12/06/2018 for OMT. Patient states she is feeling pretty good.  Mild tightness with certain activities.  Has been doing more in the yard and noticing more discomfort and pain.  Denies of any radiation down the leg patient has not been very active.  Not doing the exercises regularly at the moment.      Past Medical History:  Diagnosis Date  . Complication of anesthesia   . History of vertigo   . Hx of varicella   . PONV (postoperative nausea and vomiting)    Past Surgical History:  Procedure Laterality Date  . WISDOM TOOTH EXTRACTION     Social History   Socioeconomic History  . Marital status: Married    Spouse name: Not on file  . Number of children: Not on file  . Years of education: Not on file  . Highest education level: Not on file  Occupational History  . Not on file  Tobacco Use  . Smoking status: Never Smoker  . Smokeless tobacco: Never Used  Substance and Sexual Activity  . Alcohol use: Yes    Comment: Occasional  . Drug use: No  . Sexual activity: Yes  Other Topics Concern  . Not on file  Social History Narrative  . Not on file   Social Determinants of Health   Financial Resource Strain:   . Difficulty of Paying Living Expenses: Not on file  Food Insecurity:   . Worried About Patent examiner in the Last Year: Not on file  . Ran Out of Food in the Last Year: Not on file  Transportation Needs:   . Lack of Transportation (Medical): Not on file  . Lack of Transportation (Non-Medical): Not on file  Physical Activity:   . Days of Exercise per Week: Not on file  . Minutes of Exercise per Session: Not on file  Stress:   . Feeling of Stress : Not on file  Social Connections:   . Frequency of Communication with Friends and Family: Not on file  . Frequency of Social Gatherings with Friends and Family: Not on file  . Attends Religious Services: Not on file  . Active Member of Clubs or Organizations: Not on file  . Attends Banker Meetings: Not on file  . Marital Status: Not on file   No Known Allergies Family History  Problem Relation Age of Onset  . Thyroid disease Mother   . Migraines Mother   . Thyroid disease Maternal Aunt   . Migraines Maternal Aunt   . Cancer Paternal Aunt   . Hypertension Paternal Grandmother   . Marfan syndrome Maternal Grandfather     Current Outpatient Medications (Endocrine & Metabolic):  .  levonorgestrel-ethinyl estradiol (JOLESSA) 0.15-0.03 MG tablet, Jolessa 0.15 mg-30 mcg (91) tablets,3 month dose pack  Take 1 tablet every day  by oral route.    Current Outpatient Medications (Analgesics):  .  ibuprofen (ADVIL,MOTRIN) 600 MG tablet, Take 1 tablet (600 mg total) by mouth every 6 (six) hours as needed. .  naproxen sodium (ALEVE) 220 MG tablet, Take 220 mg by mouth 2 (two) times daily as needed.     Reviewed prior external information including notes and imaging from  primary care provider As well as notes that were available from care everywhere and other healthcare systems.  Past medical history, social, surgical and family history all reviewed in electronic medical record.  No pertanent information unless stated regarding to the chief complaint.   Review of Systems:  No headache, visual changes, nausea, vomiting,  diarrhea, constipation, dizziness, abdominal pain, skin rash, fevers, chills, night sweats, weight loss, swollen lymph nodes, body aches, joint swelling, chest pain, shortness of breath, mood changes. POSITIVE muscle aches  Objective  Blood pressure 100/74, pulse 92, height 5\' 6"  (1.676 m), weight 162 lb (73.5 kg), SpO2 96 %, not currently breastfeeding.   General: No apparent distress alert and oriented x3 mood and affect normal, dressed appropriately.  HEENT: Pupils equal, extraocular movements intact  Respiratory: Patient's speak in full sentences and does not appear short of breath  Cardiovascular: No lower extremity edema, non tender, no erythema  Skin: Warm dry intact with no signs of infection or rash on extremities or on axial skeleton.  Abdomen: Soft nontender  Neuro: Cranial nerves II through XII are intact, neurovascularly intact in all extremities with 2+ DTRs and 2+ pulses.  Lymph: No lymphadenopathy of posterior or anterior cervical chain or axillae bilaterally.  Gait normal with good balance and coordination.  MSK:  Non tender with full range of motion and good stability and symmetric strength and tone of shoulders, elbows, wrist, hip, knee and ankles bilaterally.  Back Exam:  Inspection: Unremarkable  Motion: Flexion 45 deg, Extension 25 deg, Side Bending to 25 deg bilaterally,  Rotation to 45 deg bilaterally  SLR laying: Negative  XSLR laying: Negative  Palpable tenderness: TTP in TL juncture . FABER: tightness on right . Sensory change: Gross sensation intact to all lumbar and sacral dermatomes.  Reflexes: 2+ at both patellar tendons, 2+ at achilles tendons, Babinski's downgoing.  Strength at foot  Plantar-flexion: 5/5 Dorsi-flexion: 5/5 Eversion: 5/5 Inversion: 5/5  Leg strength  Quad: 5/5 Hamstring: 5/5 Hip flexor: 5/5 Hip abductors: 5/5  Gait unremarkable.  Osteopathic findings   T7 extended rotated and side bent left L3 flexed rotated and side bent  right Sacrum right on right    Impression and Recommendations:     This case required medical decision making of moderate complexity. The above documentation has been reviewed and is accurate and complete Lyndal Pulley, DO       Note: This dictation was prepared with Dragon dictation along with smaller phrase technology. Any transcriptional errors that result from this process are unintentional.

## 2019-02-25 NOTE — Assessment & Plan Note (Signed)
Decision today to treat with OMT was based on Physical Exam  After verbal consent patient was treated with HVLA, ME, FPR techniques in  thoracic, lumbar and sacral areas  Patient tolerated the procedure well with improvement in symptoms  Patient given exercises, stretches and lifestyle modifications  See medications in patient instructions if given  Patient will follow up in 4-8 weeks 

## 2019-02-25 NOTE — Assessment & Plan Note (Signed)
Chronic problem but seems to be stable.  Responding well to ergonomics and home exercises.  Responding fairly well to osteopathic manipulation.  Discussed home exercises and icing regimen.  Follow-up again in 4 to 8 weeks

## 2019-02-28 ENCOUNTER — Other Ambulatory Visit: Payer: Self-pay

## 2019-02-28 ENCOUNTER — Ambulatory Visit: Payer: BC Managed Care – PPO | Admitting: Family Medicine

## 2019-02-28 ENCOUNTER — Encounter: Payer: Self-pay | Admitting: Family Medicine

## 2019-02-28 VITALS — BP 100/74 | HR 92 | Ht 66.0 in | Wt 162.0 lb

## 2019-02-28 DIAGNOSIS — M533 Sacrococcygeal disorders, not elsewhere classified: Secondary | ICD-10-CM | POA: Diagnosis not present

## 2019-02-28 DIAGNOSIS — M999 Biomechanical lesion, unspecified: Secondary | ICD-10-CM | POA: Diagnosis not present

## 2019-02-28 NOTE — Patient Instructions (Addendum)
Good to see you Doing well  Work on core See me again in 2 months

## 2019-03-20 DIAGNOSIS — Z01419 Encounter for gynecological examination (general) (routine) without abnormal findings: Secondary | ICD-10-CM | POA: Diagnosis not present

## 2019-03-20 DIAGNOSIS — Z6825 Body mass index (BMI) 25.0-25.9, adult: Secondary | ICD-10-CM | POA: Diagnosis not present

## 2019-03-28 DIAGNOSIS — Z319 Encounter for procreative management, unspecified: Secondary | ICD-10-CM | POA: Diagnosis not present

## 2019-03-30 ENCOUNTER — Ambulatory Visit: Payer: BC Managed Care – PPO | Attending: Internal Medicine

## 2019-03-30 DIAGNOSIS — Z23 Encounter for immunization: Secondary | ICD-10-CM

## 2019-03-30 NOTE — Progress Notes (Signed)
   Covid-19 Vaccination Clinic  Name:  Shawonda Kerce    MRN: [id number] DOB: April 08, 1985  03/30/2019  Ms. Crotwell was observed post Covid-19 immunization for 15 minutes without incident. She was provided with Vaccine Information Sheet and instruction to access the V-Safe system.   Ms. Strauch was instructed to call 911 with any severe reactions post vaccine: Marland Kitchen Difficulty breathing  . Swelling of face and throat  . A fast heartbeat  . A bad rash all over body  . Dizziness and weakness   Immunizations Administered    Name Date Dose VIS Date Route   Moderna COVID-19 Vaccine 03/30/2019  8:54 AM 0.5 mL 12/05/2018 Intramuscular   Manufacturer: Moderna   Lot: [id number]   NDC: 01410-301-31

## 2019-04-04 DIAGNOSIS — Z3169 Encounter for other general counseling and advice on procreation: Secondary | ICD-10-CM | POA: Diagnosis not present

## 2019-05-01 ENCOUNTER — Ambulatory Visit (INDEPENDENT_AMBULATORY_CARE_PROVIDER_SITE_OTHER): Payer: BC Managed Care – PPO | Admitting: Family Medicine

## 2019-05-01 ENCOUNTER — Other Ambulatory Visit: Payer: Self-pay

## 2019-05-01 ENCOUNTER — Encounter: Payer: Self-pay | Admitting: Family Medicine

## 2019-05-01 VITALS — BP 118/88 | HR 98 | Ht 66.0 in | Wt 158.0 lb

## 2019-05-01 DIAGNOSIS — M533 Sacrococcygeal disorders, not elsewhere classified: Secondary | ICD-10-CM

## 2019-05-01 DIAGNOSIS — M999 Biomechanical lesion, unspecified: Secondary | ICD-10-CM | POA: Diagnosis not present

## 2019-05-01 NOTE — Patient Instructions (Signed)
See me again in 8 weeks 

## 2019-05-01 NOTE — Progress Notes (Signed)
[name] [address] [place] [address] Phone: [phone] Subjective:   Bruce Donath, am serving as a scribe for Dr. Antoine Primas. This visit occurred during the SARS-CoV-2 public health emergency.  Safety protocols were in place, including screening questions prior to the visit, additional usage of staff PPE, and extensive cleaning of exam room while observing appropriate contact time as indicated for disinfecting solutions.   I'm seeing this patient by the request  of:  Patient, No Pcp Per  CC: Low back pain follow-up  [id number]  Kirsten Hunt is a 34 y.o. female coming in with complaint of back pain. Last seen on 02/28/2019 for OMT. Patient states overall doing very well.  Patient feels like she is stable at the moment.  Not taking any medications other than vitamins.  Feels like she has made improvement.      Past Medical History:  Diagnosis Date  . Complication of anesthesia   . History of vertigo   . Hx of varicella   . PONV (postoperative nausea and vomiting)    Past Surgical History:  Procedure Laterality Date  . WISDOM TOOTH EXTRACTION     Social History   Socioeconomic History  . Marital status: Married    Spouse name: Not on file  . Number of children: Not on file  . Years of education: Not on file  . Highest education level: Not on file  Occupational History  . Not on file  Tobacco Use  . Smoking status: Never Smoker  . Smokeless tobacco: Never Used  Substance and Sexual Activity  . Alcohol use: Yes    Comment: Occasional  . Drug use: No  . Sexual activity: Yes  Other Topics Concern  . Not on file  Social History Narrative  . Not on file   Social Determinants of Health   Financial Resource Strain:   . Difficulty of Paying Living Expenses:   Food Insecurity:   . Worried About Programme researcher, broadcasting/film/video in the Last Year:   . Barista in the Last Year:   Transportation Needs:   . Automotive engineer (Medical):   Marland Kitchen Lack of Transportation (Non-Medical):   Physical Activity:   . Days of Exercise per Week:   . Minutes of Exercise per Session:   Stress:   . Feeling of Stress :   Social Connections:   . Frequency of Communication with Friends and Family:   . Frequency of Social Gatherings with Friends and Family:   . Attends Religious Services:   . Active Member of Clubs or Organizations:   . Attends Banker Meetings:   Marland Kitchen Marital Status:    No Known Allergies Family History  Problem Relation Age of Onset  . Thyroid disease Mother   . Migraines Mother   . Thyroid disease Maternal Aunt   . Migraines Maternal Aunt   . Cancer Paternal Aunt   . Hypertension Paternal Grandmother   . Marfan syndrome Maternal Grandfather     Current Outpatient Medications (Endocrine & Metabolic):  .  levonorgestrel-ethinyl estradiol (JOLESSA) 0.15-0.03 MG tablet, Jolessa 0.15 mg-30 mcg (91) tablets,3 month dose pack  Take 1 tablet every day by oral route.    Current Outpatient Medications (Analgesics):  .  ibuprofen (ADVIL,MOTRIN) 600 MG tablet, Take 1 tablet (600 mg total) by mouth every 6 (six) hours as needed. .  naproxen sodium (ALEVE) 220 MG tablet, Take 220 mg by mouth 2 (two) times  daily as needed.     Reviewed prior external information including notes and imaging from  primary care provider As well as notes that were available from care everywhere and other healthcare systems.  Past medical history, social, surgical and family history all reviewed in electronic medical record.  No pertanent information unless stated regarding to the chief complaint.   Review of Systems:  No headache, visual changes, nausea, vomiting, diarrhea, constipation, dizziness, abdominal pain, skin rash, fevers, chills, night sweats, weight loss, swollen lymph nodes, body aches, joint swelling, chest pain, shortness of breath, mood changes. POSITIVE muscle aches  Objective  Blood  pressure 118/88, pulse 98, height 5\' 6"  (1.676 m), weight 158 lb (71.7 kg), SpO2 99 %, not currently breastfeeding.   General: No apparent distress alert and oriented x3 mood and affect normal, dressed appropriately.  HEENT: Pupils equal, extraocular movements intact  Respiratory: Patient's speak in full sentences and does not appear short of breath  Cardiovascular: No lower extremity edema, non tender, no erythema  Neuro: Cranial nerves II through XII are intact, neurovascularly intact in all extremities with 2+ DTRs and 2+ pulses.  Gait normal with good balance and coordination.  MSK:  Non tender with full range of motion and good stability and symmetric strength and tone of shoulders, elbows, wrist, hip, knee and ankles bilaterally.  Back exam mild tenderness over the sacroiliac joint bilaterally.  Negative FABER test.  Minimal discomfort in the paraspinal musculature.  Near full range of motion.  Osteopathic findings  T9 extended rotated and side bent left L2 flexed rotated and side bent right Sacrum right on right    Impression and Recommendations:     This case required medical decision making of moderate complexity. The above documentation has been reviewed and is accurate and complete Lyndal Pulley, DO       Note: This dictation was prepared with Dragon dictation along with smaller phrase technology. Any transcriptional errors that result from this process are unintentional.

## 2019-05-01 NOTE — Assessment & Plan Note (Signed)

## 2019-05-01 NOTE — Assessment & Plan Note (Signed)
Stable.  Making progress.  Discussed the anti-inflammatories, help which activities to do which wants to avoid.  Follow-up again 8 to 12 weeks

## 2019-05-02 ENCOUNTER — Ambulatory Visit: Payer: BC Managed Care – PPO

## 2019-05-03 DIAGNOSIS — N979 Female infertility, unspecified: Secondary | ICD-10-CM | POA: Diagnosis not present

## 2019-05-03 DIAGNOSIS — Z3141 Encounter for fertility testing: Secondary | ICD-10-CM | POA: Diagnosis not present

## 2019-05-24 DIAGNOSIS — Z3141 Encounter for fertility testing: Secondary | ICD-10-CM | POA: Diagnosis not present

## 2019-06-13 DIAGNOSIS — Z3141 Encounter for fertility testing: Secondary | ICD-10-CM | POA: Diagnosis not present

## 2019-06-20 DIAGNOSIS — Z3183 Encounter for assisted reproductive fertility procedure cycle: Secondary | ICD-10-CM | POA: Diagnosis not present

## 2019-06-28 DIAGNOSIS — Z3200 Encounter for pregnancy test, result unknown: Secondary | ICD-10-CM | POA: Diagnosis not present

## 2019-07-19 DIAGNOSIS — O09811 Supervision of pregnancy resulting from assisted reproductive technology, first trimester: Secondary | ICD-10-CM | POA: Diagnosis not present

## 2019-07-19 DIAGNOSIS — Z3A00 Weeks of gestation of pregnancy not specified: Secondary | ICD-10-CM | POA: Diagnosis not present

## 2019-07-31 DIAGNOSIS — Z3685 Encounter for antenatal screening for Streptococcus B: Secondary | ICD-10-CM | POA: Diagnosis not present

## 2019-07-31 DIAGNOSIS — Z3481 Encounter for supervision of other normal pregnancy, first trimester: Secondary | ICD-10-CM | POA: Diagnosis not present

## 2019-07-31 LAB — OB RESULTS CONSOLE HEPATITIS B SURFACE ANTIGEN: Hepatitis B Surface Ag: NEGATIVE

## 2019-07-31 LAB — OB RESULTS CONSOLE HIV ANTIBODY (ROUTINE TESTING): HIV: NONREACTIVE

## 2019-07-31 LAB — OB RESULTS CONSOLE RUBELLA ANTIBODY, IGM: Rubella: IMMUNE

## 2019-07-31 LAB — OB RESULTS CONSOLE RPR: RPR: NONREACTIVE

## 2019-08-14 DIAGNOSIS — Z3A10 10 weeks gestation of pregnancy: Secondary | ICD-10-CM | POA: Diagnosis not present

## 2019-08-14 DIAGNOSIS — Z113 Encounter for screening for infections with a predominantly sexual mode of transmission: Secondary | ICD-10-CM | POA: Diagnosis not present

## 2019-08-14 DIAGNOSIS — Z3400 Encounter for supervision of normal first pregnancy, unspecified trimester: Secondary | ICD-10-CM | POA: Diagnosis not present

## 2019-09-26 NOTE — Progress Notes (Signed)
[name] [address] [place] [address] Phone: [phone] Subjective:   Kirsten Hunt, am serving as a scribe for Dr. Antoine Primas. This visit occurred during the SARS-CoV-2 public health emergency.  Safety protocols were in place, including screening questions prior to the visit, additional usage of staff PPE, and extensive cleaning of exam room while observing appropriate contact time as indicated for disinfecting solutions.   I'm seeing this patient by the request  of:  Patient, No Pcp Per  CC: Low back pain follow-up  [id number]  Jaeden Westbay is a 34 y.o. female coming in with complaint of back and neck pain. OMT 05/01/2019. Patient is [redacted] weeks along. Patient states that overall she has been doing okay but has noticed some mild exacerbation recently.  Patient did wait until the second trimester to do more of the manipulation.  Patient states that more of a tightness, no radiation of pain.  Pregnancy is going well          Reviewed prior external information including notes and imaging from previsou exam, outside providers and external EMR if available.   As well as notes that were available from care everywhere and other healthcare systems.  Past medical history, social, surgical and family history all reviewed in electronic medical record.  No pertanent information unless stated regarding to the chief complaint.   Past Medical History:  Diagnosis Date  . Complication of anesthesia   . History of vertigo   . Hx of varicella   . PONV (postoperative nausea and vomiting)     No Known Allergies   Review of Systems:  No headache, visual changes, nausea, vomiting, diarrhea, constipation, dizziness, abdominal pain, skin rash, fevers, chills, night sweats, weight loss, swollen lymph nodes, body aches, joint swelling, chest pain, shortness of breath, mood changes. POSITIVE muscle aches  Objective  Blood pressure 110/82, pulse 88,  height 5\' 6"  (1.676 m), weight 159 lb (72.1 kg), SpO2 99 %, not currently breastfeeding.   General: No apparent distress alert and oriented x3 mood and affect normal, dressed appropriately.  HEENT: Pupils equal, extraocular movements intact  Respiratory: Patient's speak in full sentences and does not appear short of breath  Cardiovascular: No lower extremity edema, non tender, no erythema  Neuro: Cranial nerves II through XII are intact, neurovascularly intact in all extremities with 2+ DTRs and 2+ pulses.  Gait normal with good balance and coordination.  MSK:  Non tender with full range of motion and good stability and symmetric strength and tone of shoulders, elbows, wrist, hip, knee and ankles bilaterally.  Back -low back exam does have some mild loss of lordosis, some tenderness to palpation in the paraspinal musculature lumbar spine more around the right sacroiliac joint tightness with on the right  Osteopathic findings  T6 extended rotated and side bent left L2 flexed rotated and side bent right Sacrum right on right       Assessment and Plan:    Nonallopathic problems  Decision today to treat with OMT was based on Physical Exam  After verbal consent patient was treated with  ME, FPR techniques in  thoracic, lumbar, and sacral  areas, avoided HVLA today.  We did discuss manipulation during pregnancy and patient understood and wanted to continue.  Change techniques were appropriate.  Patient tolerated the procedure well with improvement in symptoms  Patient given exercises, stretches and lifestyle modifications  See medications in patient instructions if given  Patient will follow  up in 4-8 weeks      The above documentation has been reviewed and is accurate and complete Judi Saa, DO       Note: This dictation was prepared with Dragon dictation along with smaller phrase technology. Any transcriptional errors that result from this process are  unintentional.

## 2019-09-27 ENCOUNTER — Other Ambulatory Visit: Payer: Self-pay

## 2019-09-27 ENCOUNTER — Ambulatory Visit (INDEPENDENT_AMBULATORY_CARE_PROVIDER_SITE_OTHER): Payer: BC Managed Care – PPO | Admitting: Family Medicine

## 2019-09-27 ENCOUNTER — Encounter: Payer: Self-pay | Admitting: Family Medicine

## 2019-09-27 VITALS — BP 110/82 | HR 88 | Ht 66.0 in | Wt 159.0 lb

## 2019-09-27 DIAGNOSIS — M999 Biomechanical lesion, unspecified: Secondary | ICD-10-CM | POA: Diagnosis not present

## 2019-09-27 DIAGNOSIS — M533 Sacrococcygeal disorders, not elsewhere classified: Secondary | ICD-10-CM

## 2019-09-27 NOTE — Patient Instructions (Signed)
Congrats Maybe more weights and less yoga See me in 4-5 weeks

## 2019-09-27 NOTE — Assessment & Plan Note (Signed)
Patient is now [redacted] weeks pregnant.  Does have some very mild exacerbation.  Patient will be avoiding anti-inflammatories for now.  Discussed Tylenol use instead.  Discussed core strengthening.  Increase activity slowly.  Discussed more some mild weights and range of motion exercises.  Follow-up again 4 to 8 weeks

## 2019-10-16 DIAGNOSIS — Z3A19 19 weeks gestation of pregnancy: Secondary | ICD-10-CM | POA: Diagnosis not present

## 2019-10-16 DIAGNOSIS — Z36 Encounter for antenatal screening for chromosomal anomalies: Secondary | ICD-10-CM | POA: Diagnosis not present

## 2019-10-30 NOTE — Progress Notes (Signed)
  [name] [address] [place] [address] Phone: [phone] Subjective:   I Ronelle Nigh am serving as a Neurosurgeon for Dr. Antoine Primas.  This visit occurred during the SARS-CoV-2 public health emergency.  Safety protocols were in place, including screening questions prior to the visit, additional usage of staff PPE, and extensive cleaning of exam room while observing appropriate contact time as indicated for disinfecting solutions.   I'm seeing this patient by the request  of:  Patient, No Pcp Per  CC: Low back pain follow-up  [id number]  Kirsten Hunt is a 34 y.o. female coming in with complaint of back and neck pain. OMT 09/27/2019. Patient states she is doing well.  Patient is now [redacted] weeks pregnant.  Mild tightness in lower back but overall doing relatively well.          Reviewed prior external information including notes and imaging from previsou exam, outside providers and external EMR if available.   As well as notes that were available from care everywhere and other healthcare systems.  Past medical history, social, surgical and family history all reviewed in electronic medical record.  No pertanent information unless stated regarding to the chief complaint.   Past Medical History:  Diagnosis Date  . Complication of anesthesia   . History of vertigo   . Hx of varicella   . PONV (postoperative nausea and vomiting)     No Known Allergies   Review of Systems:  No headache, visual changes, nausea, vomiting, diarrhea, constipation, dizziness, abdominal pain, skin rash, fevers, chills, night sweats, weight loss, swollen lymph nodes, body aches, joint swelling, chest pain, shortness of breath, mood changes. POSITIVE muscle aches  Objective  Blood pressure 110/60, pulse 86, height 5\' 6"  (1.676 m), weight 164 lb (74.4 kg), SpO2 99 %, not currently breastfeeding.   General: No apparent distress alert and oriented x3 mood and affect  normal, dressed appropriately.  HEENT: Pupils equal, extraocular movements intact  Respiratory: Patient's speak in full sentences and does not appear short of breath  Cardiovascular: No lower extremity edema, non tender, no erythema  Abdominal exam shows the patient is gravid Gait normal with good balance and coordination.  MSK:  tender with full range of motion and good stability and symmetric strength and tone of shoulders, elbows, wrist, hip, knee and ankles bilaterally.    Osteopathic findings   T8 extended rotated and side bent right L4 flexed rotated and side bent left  Sacrum right on right       Assessment and Plan:         The above documentation has been reviewed and is accurate and complete , DO       Note: This dictation was prepared with Dragon dictation along with smaller phrase technology. Any transcriptional errors that result from this process are unintentional.

## 2019-10-31 ENCOUNTER — Other Ambulatory Visit: Payer: Self-pay

## 2019-10-31 ENCOUNTER — Ambulatory Visit (INDEPENDENT_AMBULATORY_CARE_PROVIDER_SITE_OTHER): Payer: BC Managed Care – PPO | Admitting: Family Medicine

## 2019-10-31 ENCOUNTER — Encounter: Payer: Self-pay | Admitting: Family Medicine

## 2019-10-31 VITALS — BP 110/60 | HR 86 | Ht 66.0 in | Wt 164.0 lb

## 2019-10-31 DIAGNOSIS — M999 Biomechanical lesion, unspecified: Secondary | ICD-10-CM | POA: Diagnosis not present

## 2019-10-31 DIAGNOSIS — M533 Sacrococcygeal disorders, not elsewhere classified: Secondary | ICD-10-CM

## 2019-10-31 NOTE — Patient Instructions (Signed)
Great to see you Keep it up See me again in 4-5 weeks

## 2019-10-31 NOTE — Assessment & Plan Note (Signed)
Continue sacroiliac dysfunction in a pregnant lady.  2nd set Parsons.  Doing very well.  Patient of course avoiding anti-inflammatories, Tylenol if needed, discussed icing regimen.  Follow-up again 4 to 5 weeks.

## 2019-10-31 NOTE — Assessment & Plan Note (Addendum)
   Decision today to treat with OMT was based on Physical Exam  After verbal consent patient was treated with , ME, FPR techniques in  thoracic, lumbar and sacral areas, all areas are chronic   Patient tolerated the procedure well with improvement in symptoms  Patient given exercises, stretches and lifestyle modifications  See medications in patient instructions if given  Patient will follow up in 4-8 weeks 

## 2019-11-12 DIAGNOSIS — O4402 Placenta previa specified as without hemorrhage, second trimester: Secondary | ICD-10-CM | POA: Diagnosis not present

## 2019-11-12 DIAGNOSIS — O09522 Supervision of elderly multigravida, second trimester: Secondary | ICD-10-CM | POA: Diagnosis not present

## 2019-11-12 DIAGNOSIS — Z23 Encounter for immunization: Secondary | ICD-10-CM | POA: Diagnosis not present

## 2019-11-12 DIAGNOSIS — O09812 Supervision of pregnancy resulting from assisted reproductive technology, second trimester: Secondary | ICD-10-CM | POA: Diagnosis not present

## 2019-11-12 DIAGNOSIS — Z3A23 23 weeks gestation of pregnancy: Secondary | ICD-10-CM | POA: Diagnosis not present

## 2019-11-22 ENCOUNTER — Other Ambulatory Visit: Payer: Self-pay

## 2019-11-22 ENCOUNTER — Observation Stay (HOSPITAL_COMMUNITY)
Admission: AD | Admit: 2019-11-22 | Discharge: 2019-11-23 | Disposition: A | Discharge status: Home / Self Care | Payer: BC Managed Care – PPO | Attending: Obstetrics and Gynecology | Admitting: Obstetrics and Gynecology

## 2019-11-22 ENCOUNTER — Encounter (HOSPITAL_COMMUNITY): Payer: Self-pay | Admitting: Obstetrics and Gynecology

## 2019-11-22 DIAGNOSIS — Z20822 Contact with and (suspected) exposure to covid-19: Secondary | ICD-10-CM | POA: Diagnosis not present

## 2019-11-22 DIAGNOSIS — E039 Hypothyroidism, unspecified: Secondary | ICD-10-CM | POA: Diagnosis not present

## 2019-11-22 DIAGNOSIS — O209 Hemorrhage in early pregnancy, unspecified: Secondary | ICD-10-CM | POA: Diagnosis not present

## 2019-11-22 DIAGNOSIS — O99280 Endocrine, nutritional and metabolic diseases complicating pregnancy, unspecified trimester: Secondary | ICD-10-CM | POA: Diagnosis not present

## 2019-11-22 DIAGNOSIS — Z3A00 Weeks of gestation of pregnancy not specified: Secondary | ICD-10-CM | POA: Diagnosis not present

## 2019-11-22 DIAGNOSIS — O4400 Placenta previa specified as without hemorrhage, unspecified trimester: Principal | ICD-10-CM | POA: Diagnosis present

## 2019-11-22 DIAGNOSIS — Z3A24 24 weeks gestation of pregnancy: Secondary | ICD-10-CM | POA: Diagnosis not present

## 2019-11-22 DIAGNOSIS — O4412 Placenta previa with hemorrhage, second trimester: Secondary | ICD-10-CM | POA: Diagnosis not present

## 2019-11-22 LAB — HIV ANTIBODY (ROUTINE TESTING W REFLEX): HIV Screen 4th Generation wRfx: NONREACTIVE

## 2019-11-22 LAB — CBC
HCT: 31.1 % — ABNORMAL LOW (ref 36.0–46.0)
Hemoglobin: 10.4 g/dL — ABNORMAL LOW (ref 12.0–15.0)
MCH: 31.9 pg (ref 26.0–34.0)
MCHC: 33.4 g/dL (ref 30.0–36.0)
MCV: 95.4 fL (ref 80.0–100.0)
Platelets: 171 10*3/uL (ref 150–400)
RBC: 3.26 MIL/uL — ABNORMAL LOW (ref 3.87–5.11)
RDW: 12.4 % (ref 11.5–15.5)
WBC: 9.6 10*3/uL (ref 4.0–10.5)
nRBC: 0 % (ref 0.0–0.2)

## 2019-11-22 LAB — POCT PREGNANCY, URINE: Preg Test, Ur: POSITIVE — AB

## 2019-11-22 LAB — HCG, QUANTITATIVE, PREGNANCY: hCG, Beta Chain, Quant, S: 12374 m[IU]/mL — ABNORMAL HIGH (ref <5)

## 2019-11-22 MED ORDER — LACTATED RINGERS IV SOLN: INTRAVENOUS | Status: DC

## 2019-11-22 MED ORDER — ACETAMINOPHEN 325 MG PO TABS: 650.0000 mg | ORAL_TABLET | ORAL | Status: DC | PRN

## 2019-11-22 MED ORDER — BETAMETHASONE SOD PHOS & ACET 6 (3-3) MG/ML IJ SUSP
12.0000 mg | INTRAMUSCULAR | Status: AC
  Administered 2019-11-22 – 2019-11-23 (×2): 12 mg via INTRAMUSCULAR
  Filled 2019-11-22: qty 5

## 2019-11-22 MED ORDER — PRENATAL MULTIVITAMIN CH
1.0000 | ORAL_TABLET | Freq: Every day | ORAL | Status: DC
  Administered 2019-11-23: 1 via ORAL
  Filled 2019-11-22: qty 1

## 2019-11-22 MED ORDER — LEVOTHYROXINE SODIUM 25 MCG PO TABS
25.0000 ug | ORAL_TABLET | Freq: Every day | ORAL | Status: DC
  Administered 2019-11-23: 25 ug via ORAL
  Filled 2019-11-22: qty 1

## 2019-11-22 MED ORDER — CALCIUM CARBONATE ANTACID 500 MG PO CHEW: 2.0000 | CHEWABLE_TABLET | ORAL | Status: DC | PRN

## 2019-11-22 MED ORDER — ZOLPIDEM TARTRATE 5 MG PO TABS: 5.0000 mg | ORAL_TABLET | Freq: Every evening | ORAL | Status: DC | PRN

## 2019-11-22 MED ORDER — DOCUSATE SODIUM 100 MG PO CAPS
100.0000 mg | ORAL_CAPSULE | Freq: Every day | ORAL | Status: DC
  Administered 2019-11-23: 100 mg via ORAL
  Filled 2019-11-22: qty 1

## 2019-11-22 NOTE — H&P (Signed)
Kirsten Hunt is a 34 y.o. female presenting for vaginal bleeding. This is an IVF pregnancy complicated by placenta previa. Tonight she went to BR and had blood in toilet. No pain, no cramps, no trauma. She feels fine. Also hypothyroid on replacement. OB History    Gravida  3   Para  2   Term  2   Preterm      AB      Living  2     SAB      TAB      Ectopic      Multiple  0   Live Births  2          Past Medical History:  Diagnosis Date  . Complication of anesthesia   . History of vertigo   . Hx of varicella   . PONV (postoperative nausea and vomiting)    Past Surgical History:  Procedure Laterality Date  . WISDOM TOOTH EXTRACTION     Family History: family history includes Cancer in her paternal aunt; Hypertension in her paternal grandmother; Marfan syndrome in her maternal grandfather; Migraines in her maternal aunt and mother; Thyroid disease in her maternal aunt and mother. Social History:  reports that she has never smoked. She has never used smokeless tobacco. She reports current alcohol use. She reports that she does not use drugs.     Maternal Diabetes: No Genetic Screening: Normal PGT Maternal Ultrasounds/Referrals: Normal Fetal Ultrasounds or other Referrals: placenta previa Maternal Substance Abuse:  No Significant Maternal Medications:  None Significant Maternal Lab Results:  None Other Comments:  None  Review of Systems  Constitutional: Negative for fever.  Eyes: Negative for visual disturbance.  Gastrointestinal: Negative for abdominal pain.  Neurological: Negative for headaches.   Maternal Medical History:  Reason for admission: Vaginal bleeding.   Fetal activity: Perceived fetal activity is normal.        Blood pressure 103/60, pulse 76, temperature 98.5 F (36.9 C), resp. rate 18, height 5\' 6"  (1.676 m), weight 77.1 kg, not currently breastfeeding. Exam Physical Exam Cardiovascular:     Rate and Rhythm: Normal rate.  Pulmonary:      Effort: Pulmonary effort is normal.  Abdominal:     Comments: Uterus soft, NT   speculum exam by CNM>small amount of blood in vaginal vault. Cx closed to inspection and no continued bleeding   FHT reactive UCs none tracing  Prenatal labs: ABO, Rh:   Antibody:   Rubella:   RPR:    HBsAg:    HIV:    GBS:     Assessment/Plan: 34 yo G3P2 @ 24 6/7 wks Placenta previa with small bleed-stable at this time IVF pregnancy Hypothyroid Admit to antenatal, IV fluids, BMZ, fetal monitors tonight MFM consult with U/S in am D/W patient and husband above. D/W possible emergent C/S for severe bleeding   02-22-1998 II 11/22/2019, 11:24 PM

## 2019-11-22 NOTE — MAU Provider Note (Signed)
First Provider Initiated Contact with Patient 11/22/19 2242      Chief Complaint:  Vaginal Bleeding   Kirsten Hunt is  34 y.o. G3P2002 at [redacted]w[redacted]d presents complaining of Vaginal Bleeding . She has a known previa. Had some pink all along her panty liner, then noticed some bright red when wiping and saw some in the toilet.  No pain at all.  Did not notice any bleeding when she wiped in MAU.   Obstetrical/Gynecological History: OB History    Gravida  3   Para  2   Term  2   Preterm      AB      Living  2     SAB      TAB      Ectopic      Multiple  0   Live Births  2          Past Medical History: Past Medical History:  Diagnosis Date  . Complication of anesthesia   . History of vertigo   . Hx of varicella   . PONV (postoperative nausea and vomiting)     Past Surgical History: Past Surgical History:  Procedure Laterality Date  . WISDOM TOOTH EXTRACTION      Family History: Family History  Problem Relation Age of Onset  . Thyroid disease Mother   . Migraines Mother   . Thyroid disease Maternal Aunt   . Migraines Maternal Aunt   . Cancer Paternal Aunt   . Hypertension Paternal Grandmother   . Marfan syndrome Maternal Grandfather     Social History: Social History   Tobacco Use  . Smoking status: Never Smoker  . Smokeless tobacco: Never Used  Vaping Use  . Vaping Use: Never used  Substance Use Topics  . Alcohol use: Yes    Comment: Occasional wine  . Drug use: No    Allergies: No Known Allergies  Meds:  Medications Prior to Admission  Medication Sig Dispense Refill Last Dose  . cholecalciferol (VITAMIN D3) 25 MCG (1000 UNIT) tablet Take 1,000 Units by mouth daily.   11/21/2019 at Unknown time  . levothyroxine (SYNTHROID) 25 MCG tablet Take 25 mcg by mouth daily before breakfast.   11/22/2019 at Unknown time  . Prenatal Vit-Fe Fumarate-FA (PRENATAL MULTIVITAMIN) TABS tablet Take 1 tablet by mouth daily at 12 noon.   11/21/2019 at Unknown  time  . ibuprofen (ADVIL,MOTRIN) 600 MG tablet Take 1 tablet (600 mg total) by mouth every 6 (six) hours as needed. 90 tablet 1   . levonorgestrel-ethinyl estradiol (JOLESSA) 0.15-0.03 MG tablet Jolessa 0.15 mg-30 mcg (91) tablets,3 month dose pack  Take 1 tablet every day by oral route.     . naproxen sodium (ALEVE) 220 MG tablet Take 220 mg by mouth 2 (two) times daily as needed.       Review of Systems   Constitutional: Negative for fever and chills Eyes: Negative for visual disturbances Respiratory: Negative for shortness of breath, dyspnea Cardiovascular: Negative for chest pain or palpitations  Gastrointestinal: Negative for vomiting, diarrhea and constipation Genitourinary: Negative for dysuria and urgency Musculoskeletal: Negative for back pain, joint pain, myalgias.  Normal ROM  Neurological: Negative for dizziness and headaches    Physical Exam  Blood pressure 103/60, pulse 76, temperature 98.5 F (36.9 C), resp. rate 18, height 5\' 6"  (1.676 m), weight 77.1 kg, not currently breastfeeding. GENERAL: Well-developed, well-nourished female in no acute distress.  LUNGS: Normal respiratory effort HEART: Regular rate and rhythm. ABDOMEN: Soft, nontender,  nondistended, gravid.  EXTREMITIES: Nontender, no edema, 2+ distal pulses. DTR's 2+ PELVIC:  SSE:  Scant amount of dark red blood/mucus.  Cx non friable, appears multiparous. Normal appearing scant vaginal discharge. FHT:  Baseline rate 140 bpm   Variability moderate  Accelerations present   Decelerations none Contractions: Every 0 mins   Labs: Results for orders placed or performed during the hospital encounter of 11/22/19 (from the past 24 hour(s))  Pregnancy, urine POC   Collection Time: 11/22/19  9:36 PM  Result Value Ref Range   Preg Test, Ur POSITIVE (A) NEGATIVE  CBC   Collection Time: 11/22/19 10:17 PM  Result Value Ref Range   WBC 9.6 4.0 - 10.5 K/uL   RBC 3.26 (L) 3.87 - 5.11 MIL/uL   Hemoglobin 10.4 (L) 12.0  - 15.0 g/dL   HCT 69.7 (L) 36 - 46 %   MCV 95.4 80.0 - 100.0 fL   MCH 31.9 26.0 - 34.0 pg   MCHC 33.4 30.0 - 36.0 g/dL   RDW 94.8 01.6 - 55.3 %   Platelets 171 150 - 400 K/uL   nRBC 0.0 0.0 - 0.2 %   Imaging Studies:  No results found.  Assessment: Kirsten Hunt is  34 y.o. G3P2002 at [redacted]w[redacted]d presents with vaginal bleeding/stable w/known previa..  Plan: Dr. Debroah Loop notified and recommended admission.  Dr. Huntley Dec given report and agrees w/POC.   Jacklyn Shell, CNM  11/18/202111:12 PM

## 2019-11-22 NOTE — MAU Note (Addendum)
Hx previa. Tonight I had red spotting that now is pink. Did see clots in toilet when bleeding started. No pain . Reports good FM

## 2019-11-23 ENCOUNTER — Inpatient Hospital Stay (HOSPITAL_BASED_OUTPATIENT_CLINIC_OR_DEPARTMENT_OTHER): Payer: BC Managed Care – PPO

## 2019-11-23 DIAGNOSIS — O99280 Endocrine, nutritional and metabolic diseases complicating pregnancy, unspecified trimester: Secondary | ICD-10-CM | POA: Diagnosis not present

## 2019-11-23 DIAGNOSIS — O209 Hemorrhage in early pregnancy, unspecified: Secondary | ICD-10-CM | POA: Diagnosis not present

## 2019-11-23 DIAGNOSIS — O4402 Placenta previa specified as without hemorrhage, second trimester: Secondary | ICD-10-CM | POA: Diagnosis not present

## 2019-11-23 DIAGNOSIS — Z363 Encounter for antenatal screening for malformations: Secondary | ICD-10-CM | POA: Diagnosis not present

## 2019-11-23 DIAGNOSIS — Z3A00 Weeks of gestation of pregnancy not specified: Secondary | ICD-10-CM | POA: Diagnosis not present

## 2019-11-23 DIAGNOSIS — O4412 Placenta previa with hemorrhage, second trimester: Secondary | ICD-10-CM | POA: Diagnosis not present

## 2019-11-23 DIAGNOSIS — Z20822 Contact with and (suspected) exposure to covid-19: Secondary | ICD-10-CM | POA: Diagnosis not present

## 2019-11-23 DIAGNOSIS — E039 Hypothyroidism, unspecified: Secondary | ICD-10-CM | POA: Diagnosis not present

## 2019-11-23 DIAGNOSIS — Z3A25 25 weeks gestation of pregnancy: Secondary | ICD-10-CM

## 2019-11-23 DIAGNOSIS — O4400 Placenta previa specified as without hemorrhage, unspecified trimester: Secondary | ICD-10-CM | POA: Diagnosis not present

## 2019-11-23 DIAGNOSIS — O4692 Antepartum hemorrhage, unspecified, second trimester: Secondary | ICD-10-CM | POA: Diagnosis not present

## 2019-11-23 LAB — TYPE AND SCREEN
ABO/RH(D): A POS
Antibody Screen: NEGATIVE

## 2019-11-23 LAB — RESPIRATORY PANEL BY RT PCR (FLU A&B, COVID)
Influenza A by PCR: NEGATIVE
Influenza B by PCR: NEGATIVE
SARS Coronavirus 2 by RT PCR: NEGATIVE

## 2019-11-23 NOTE — Progress Notes (Addendum)
S: Feeling fine. No further bleeding at all.   O:  Vitals:   11/23/19 0000 11/23/19 0521  BP: 100/61 (!) 101/53  Pulse: 71 63  Resp: 18 17  Temp: 98.3 F (36.8 C) 98.1 F (36.7 C)  SpO2: 96% 100%    Gen: Well appearing NAD Pulm: NWOB CV: RR Abd: soft, nondistended, gravid GYN: NO bleeding on pad  A/P: 33 yo G3P2002 s/p one small bleed in the setting of placenta previa.  No further bleeding.  MFM consultation placed yesterday and awaiting consultation.  Given complete resolution, very small bleed, and first bleed, patient may be candidate for outpatient management. Will defer to MFM and their [place].  FHT Cat 1. S/p BMZ x 1 last night around 11pm. D/w patient plan.    Rosie Fate MD

## 2019-11-23 NOTE — Discharge Summary (Signed)
Physician Discharge Summary  Patient ID: Kirsten Hunt MRN: [id number] DOB/AGE: 1985/10/02 34 y.o.  Admit date: 11/22/2019 Discharge date: 11/23/2019  Admission Diagnoses:  Discharge Diagnoses:  Active Problems:   Placenta previa   Bleeding in early pregnancy   Discharged Condition: good  Hospital Course: 34 yo G3P2002 presented @ 24.6 wga with vaginal bleeding. She Had a small amount that was resolved without intervention. An [place] was performed by MFM which was reassuring. On HD#2 she remained stable without further bleeding. She received two shots of BMZ. MFM stated she could go home with outpatient observation.   Consults: MFM  Significant Diagnostic Studies: labs:  CBC    Component Value Date/Time   WBC 9.6 11/22/2019 2217   RBC 3.26 (L) 11/22/2019 2217   HGB 10.4 (L) 11/22/2019 2217   HCT 31.1 (L) 11/22/2019 2217   PLT 171 11/22/2019 2217   MCV 95.4 11/22/2019 2217   MCH 31.9 11/22/2019 2217   MCHC 33.4 11/22/2019 2217   RDW 12.4 11/22/2019 2217     Treatments: none  Discharge Exam: Blood pressure 112/62, pulse 86, temperature 98.2 F (36.8 C), temperature source Oral, resp. rate 18, height 5\' 6"  (1.676 m), weight 77.1 kg, SpO2 100 %, not currently breastfeeding. General appearance: alert and cooperative Resp: NWOB Cardio: regular rate and rhythm GI: soft, non-tender; bowel sounds normal; no masses,  no organomegaly and gravid Extremities: extremities normal, atraumatic, no cyanosis or edema  GYN: No blood on pad  Disposition:    Allergies as of 11/23/2019   No Known Allergies     Medication List    STOP taking these medications   ibuprofen 600 MG tablet Commonly known as: ADVIL   Jolessa 0.15-0.03 MG tablet Generic drug: levonorgestrel-ethinyl estradiol   naproxen sodium 220 MG tablet Commonly known as: ALEVE     TAKE these medications   cholecalciferol 25 MCG (1000 UNIT) tablet Commonly known as: VITAMIN D3 Take 1,000 Units by mouth daily.    levothyroxine 25 MCG tablet Commonly known as: SYNTHROID Take 25 mcg by mouth daily before breakfast.   prenatal multivitamin Tabs tablet Take 1 tablet by mouth daily at 12 noon.        Signed: 07-12-1996 11/23/2019, 2:17 PM

## 2019-11-23 NOTE — Consult Note (Signed)
MFM Note  Kirsten Hunt is a 35 year old gravida 3 para 2 currently at 25 weeks and 0 days.  She was seen in consultation today due to placenta previa with vaginal bleeding.  The patient was admitted to the hospital last night after she went to the bathroom and had some bright red blood in the toilet.  She has a known placenta previa that was noted during her earlier ultrasounds.  This is the first time that she has experienced vaginal bleeding in her current pregnancy.  She denies feeling any abdominal pain, cramping, or contractions.  This is an IVF pregnancy.  The patient reports that she had prenatal genetic screening as part of the IVF process that showed normal chromosomes.  She reports that she had a normal fetal anatomy scan performed in your office.  Her blood type is A positive.  Her obstetrical history includes two prior uncomplicated full-term vaginal deliveries.  Other than hypothyroidism, she denies any other significant past medical or surgical history.   Due to vaginal bleeding with placenta previa, she was admitted to the hospital and given a course of antenatal corticosteroids.  Today, the patient reports that her vaginal bleeding has resolved.  She reports feeling positive fetal movements.  An ultrasound performed today shows that the fetus is in the vertex presentation.  The overall EFW measured 1 pound 12 ounces (55th percentile for her gestational age).  There was normal amniotic fluid noted.  The distal edge of the normal-appearing anterior placenta appears to be covering the internal cervical os.  The majority of the placenta appears to be away from the cervix.  There were no sonographic signs of placenta accreta noted.  The implications and management of placenta previa was discussed with the patient and her husband today.  They were advised that as only the distal edge of the anterior placenta is covering the internal cervical os, I believe that there probably is a greater than  90% chance that the placenta previa will resolve later in her pregnancy.  She should continue to be followed with serial ultrasound exams every 4 to 5 weeks in your office to assess the fetal growth and placental location.  These ultrasounds may be performed in your office.  They understand that should the placenta previa persist closer to the time of delivery, that a cesarean delivery will be recommended at 37 weeks.  We will be happy to see her for another ultrasound later in her pregnancy should there be any further questions regarding the placental location.  As the patient is currently stable and no longer experiencing any vaginal bleeding, consideration may be given for discharge home on modified bedrest later this evening.  The second dose of antenatal corticosteroids may be given early so that she may be discharged at a reasonable hour.  Pelvic rest was advised.  At the end of the consultation, the patient and her husband stated that all their questions have been answered to their complete satisfaction.    Thank you for referring this patient for Maternal-Fetal Medicine consultation.

## 2019-11-23 NOTE — Progress Notes (Signed)
Reviewed discharge antepartum instructions with patient regarding medications, when to call MD/go to [facility], and to call to schedule continued follow up antenatal appointment with OB. Patient asked appropriate questions and verbalized understanding of instructions.

## 2019-11-27 ENCOUNTER — Encounter (HOSPITAL_COMMUNITY): Payer: Self-pay | Admitting: Obstetrics and Gynecology

## 2019-11-27 ENCOUNTER — Other Ambulatory Visit: Payer: Self-pay

## 2019-11-27 ENCOUNTER — Inpatient Hospital Stay (HOSPITAL_COMMUNITY)
Admission: AD | Admit: 2019-11-27 | Discharge: 2019-12-03 | DRG: 833 | Disposition: A | Discharge status: Home / Self Care | Payer: BC Managed Care – PPO | Attending: Obstetrics and Gynecology | Admitting: Obstetrics and Gynecology

## 2019-11-27 DIAGNOSIS — O4400 Placenta previa specified as without hemorrhage, unspecified trimester: Secondary | ICD-10-CM | POA: Diagnosis present

## 2019-11-27 DIAGNOSIS — O4412 Placenta previa with hemorrhage, second trimester: Principal | ICD-10-CM | POA: Diagnosis present

## 2019-11-27 DIAGNOSIS — Z3A25 25 weeks gestation of pregnancy: Secondary | ICD-10-CM | POA: Diagnosis not present

## 2019-11-27 DIAGNOSIS — Z20822 Contact with and (suspected) exposure to covid-19: Secondary | ICD-10-CM | POA: Diagnosis not present

## 2019-11-27 DIAGNOSIS — Z3689 Encounter for other specified antenatal screening: Secondary | ICD-10-CM

## 2019-11-27 DIAGNOSIS — O4402 Placenta previa specified as without hemorrhage, second trimester: Secondary | ICD-10-CM

## 2019-11-27 DIAGNOSIS — Z3A26 26 weeks gestation of pregnancy: Secondary | ICD-10-CM | POA: Diagnosis not present

## 2019-11-27 DIAGNOSIS — O09522 Supervision of elderly multigravida, second trimester: Secondary | ICD-10-CM

## 2019-11-27 DIAGNOSIS — Z6790 Unspecified blood type, Rh positive: Secondary | ICD-10-CM

## 2019-11-27 DIAGNOSIS — O4410 Placenta previa with hemorrhage, unspecified trimester: Secondary | ICD-10-CM

## 2019-11-27 DIAGNOSIS — O4690 Antepartum hemorrhage, unspecified, unspecified trimester: Secondary | ICD-10-CM

## 2019-11-27 LAB — TYPE AND SCREEN
ABO/RH(D): A POS
Antibody Screen: NEGATIVE

## 2019-11-27 LAB — CBC
HCT: 36.3 % (ref 36.0–46.0)
Hemoglobin: 12.1 g/dL (ref 12.0–15.0)
MCH: 32.3 pg (ref 26.0–34.0)
MCHC: 33.3 g/dL (ref 30.0–36.0)
MCV: 96.8 fL (ref 80.0–100.0)
Platelets: 221 10*3/uL (ref 150–400)
RBC: 3.75 MIL/uL — ABNORMAL LOW (ref 3.87–5.11)
RDW: 12.5 % (ref 11.5–15.5)
WBC: 11.5 10*3/uL — ABNORMAL HIGH (ref 4.0–10.5)
nRBC: 0 % (ref 0.0–0.2)

## 2019-11-27 LAB — RESPIRATORY PANEL BY RT PCR (FLU A&B, COVID)
Influenza A by PCR: NEGATIVE
Influenza B by PCR: NEGATIVE
SARS Coronavirus 2 by RT PCR: NEGATIVE

## 2019-11-27 MED ORDER — CALCIUM CARBONATE ANTACID 500 MG PO CHEW: 2.0000 | CHEWABLE_TABLET | ORAL | Status: DC | PRN

## 2019-11-27 MED ORDER — ZOLPIDEM TARTRATE 5 MG PO TABS: 5.0000 mg | ORAL_TABLET | Freq: Every evening | ORAL | Status: DC | PRN

## 2019-11-27 MED ORDER — ACETAMINOPHEN 325 MG PO TABS: 650.0000 mg | ORAL_TABLET | ORAL | Status: DC | PRN

## 2019-11-27 MED ORDER — DOCUSATE SODIUM 100 MG PO CAPS
100.0000 mg | ORAL_CAPSULE | Freq: Every day | ORAL | Status: DC
  Administered 2019-11-28 – 2019-12-02 (×5): 100 mg via ORAL
  Filled 2019-11-27 (×5): qty 1

## 2019-11-27 MED ORDER — PRENATAL MULTIVITAMIN CH
1.0000 | ORAL_TABLET | Freq: Every day | ORAL | Status: DC
  Administered 2019-11-28 – 2019-12-02 (×5): 1 via ORAL
  Filled 2019-11-27 (×5): qty 1

## 2019-11-27 NOTE — H&P (Signed)
Chief Complaint  Patient presents with  . Vaginal Bleeding   Kirsten Hunt is a 34 y.o. [id number] at 7315w4d who presents to MAU for vaginal bleeding which began about 1 hour ago. Patient has known placenta previa and has already been admitted for this recently for about 24 hours.  Passing blood clots? no Blood soaking clothes? no Lightheaded/dizzy? no Significant pelvic pain or cramping/ctx? no Passed any tissue? no  Current pregnancy problems? Placenta previa, IVF  Blood Type? A Positive Allergies? NKDA           OB History    Gravida  3   Para  2   Term  2   Preterm      AB      Living  2     SAB      TAB      Ectopic      Multiple  0   Live Births  2               Past Medical History:  Diagnosis Date  . Complication of anesthesia   . History of vertigo   . Hx of varicella   . PONV (postoperative nausea and vomiting)          Past Surgical History:  Procedure Laterality Date  . WISDOM TOOTH EXTRACTION           Family History  Problem Relation Age of Onset  . Thyroid disease Mother   . Migraines Mother   . Thyroid disease Maternal Aunt   . Migraines Maternal Aunt   . Cancer Paternal Aunt   . Hypertension Paternal Grandmother   . Marfan syndrome Maternal Grandfather     Social History        Tobacco Use  . Smoking status: Never Smoker  . Smokeless tobacco: Never Used  Vaping Use  . Vaping Use: Never used  Substance Use Topics  . Alcohol use: Not Currently    Comment: Occasional wine  . Drug use: No    Allergies: No Known Allergies         Medications Prior to Admission  Medication Sig Dispense Refill Last Dose  . cholecalciferol (VITAMIN D3) 25 MCG (1000 UNIT) tablet Take 1,000 Units by mouth daily.   11/26/2019 at Unknown time  . levothyroxine (SYNTHROID) 25 MCG tablet Take 25 mcg by mouth daily before breakfast.   11/27/2019 at Unknown time  . Prenatal Vit-Fe  Fumarate-FA (PRENATAL MULTIVITAMIN) TABS tablet Take 1 tablet by mouth daily at 12 noon.   11/26/2019 at Unknown time    Review of Systems  Constitutional: Negative for chills, diaphoresis, fatigue and fever.  Eyes: Negative for visual disturbance.  Respiratory: Negative for shortness of breath.   Cardiovascular: Negative for chest pain.  Gastrointestinal: Negative for abdominal pain, constipation, diarrhea, nausea and vomiting.  Genitourinary: Positive for vaginal bleeding. Negative for dysuria, flank pain, frequency, pelvic pain, urgency and vaginal discharge.  Neurological: Negative for dizziness, weakness, light-headedness and headaches.   Physical Exam   Blood pressure 119/66, pulse (!) 106, temperature 98.5 F (36.9 C), temperature source Oral, resp. rate 16, height 5\' 6"  (1.676 m), weight 76.2 kg, SpO2 100 %, not currently breastfeeding.  Patient Vitals for the past 24 hrs:  BP Temp Temp src Pulse Resp SpO2 Height Weight  11/27/19 1520 119/66 98.5 F (36.9 C) Oral (!) 106 16 100 % -- --  11/27/19 1510 112/68 98.3 F (36.8 C) Oral 86 20 100 % -- --  11/27/19 1505 -- -- -- -- -- -- 5\' 6"  (1.676 m) 76.2 kg   Physical Exam Vitals and nursing note reviewed. Exam conducted with a chaperone present.  Constitutional:      General: She is not in acute distress.    Appearance: Normal appearance. She is normal weight. She is not ill-appearing, toxic-appearing or diaphoretic.  HENT:     Head: Normocephalic and atraumatic.  Pulmonary:     Effort: Pulmonary effort is normal.  Genitourinary:    General: Normal vulva.     Labia:        Right: No rash, tenderness or lesion.        Left: No rash, tenderness or lesion.      Vagina: Bleeding present.     Cervix: No cervical motion tenderness, discharge, friability, lesion or erythema.     Comments: Small amount of blood present in vaginal vault, bright red. Minimal active bleeding coming from cervical os. Neurological:      Mental Status: She is alert and oriented to person, place, and time.  Psychiatric:        Mood and Affect: Mood normal.        Behavior: Behavior normal.        Thought Content: Thought content normal.        Judgment: Judgment normal.    Lab Results Last 24 Hours        Results for orders placed or performed during the hospital encounter of 11/27/19 (from the past 24 hour(s))  Type and screen [facility]     Status: None (Preliminary result)   Collection Time: 11/27/19  4:38 PM  Result Value Ref Range   ABO/RH(D) PENDING    Antibody Screen PENDING    Sample Expiration      11/30/2019,2359 Performed at [facility], [address] [place], [place] [address]        Imaging Results  [place] MFM OB COMP + 14 WK  Result Date: 11/23/2019 ----------------------------------------------------------------------  OBSTETRICS REPORT                       (Signed Final 11/23/2019 04:15 pm) ---------------------------------------------------------------------- Patient Info  ID #:       [id number]                          D.O.B.:  1985-07-28 (34 yrs)  Name:       Kirsten Hunt                    Visit Date: 11/23/2019 10:42 am ---------------------------------------------------------------------- Performed By  Attending:        Ma Rings MD         Referred By:      [facility]                                                             Care  Performed By:     Sandi Mealy        [facility] ---------------------------------------------------------------------- Orders  #  Description                           Code        Ordered By  1  [place] MFM OB COMP + 14 WK                76805.01    YU FANG ----------------------------------------------------------------------  #  Order #                     Accession #                Episode #  1  [id number]                    [id number]                 [id number] ---------------------------------------------------------------------- Indications  Placenta previa specified as without           O44.02  hemorrhage, second trimester  Vaginal bleeding in pregnancy, second          O46.92  trimester  [redacted] weeks gestation of pregnancy                Z3A.25  Encounter for antenatal screening for          Z36.3  malformations ---------------------------------------------------------------------- Fetal Evaluation  Num Of Fetuses:         1  Fetal Heart Rate(bpm):  149  Cardiac Activity:       Observed  Presentation:           Cephalic  Placenta:               Anterior previa  P. Cord Insertion:      Visualized  Amniotic Fluid  AFI FV:      Within normal limits                              Largest Pocket(cm)                              5.79 ---------------------------------------------------------------------- Biometry  BPD:        64  mm     G. Age:  25w 6d         73  %    CI:         73.6   %    70 - 86                                                          FL/HC:      19.2   %    18.7 - 20.3  HC:       237   mm     G. Age:  25w 5d         56  %    HC/AC:      1.14        1.04 - 1.22  AC:      207.9  mm     G. Age:  25w 2d         52  %  FL/BPD:     71.3   %    71 - 87  FL:       45.6  mm     G. Age:  25w 1d         39  %    FL/AC:      21.9   %    20 - 24  HUM:      42.6  mm     G. Age:  25w 4d         57  %  CER:      30.5  mm     G. Age:  26w 4d         88  %  LV:        5.3  mm  CM:        6.4  mm  Est. FW:     797  gm    1 lb 12 oz      55  % ---------------------------------------------------------------------- Gestational Age  Clinical EDD:  25w 0d                                        EDD:   03/07/20  U/S Today:     25w 4d                                        EDD:   03/03/20  Best:          25w 0d     Det. By:  Clinical EDD             EDD:   03/07/20 ---------------------------------------------------------------------- Anatomy   Cranium:               Appears normal         Aortic Arch:            Appears normal  Cavum:                 Appears normal         Ductal Arch:            Not well visualized  Ventricles:            Appears normal         Diaphragm:              Appears normal  Choroid Plexus:        Appears normal         Stomach:                Appears normal, left                                                                        sided  Cerebellum:            Appears normal         Abdomen:                Appears normal  Posterior Fossa:  Appears normal         Abdominal Wall:         Appears nml (cord                                                                        insert, abd wall)  Nuchal Fold:           Not applicable (>20    Cord Vessels:           Appears normal ([redacted]                         wks GA)                                        vessel cord)  Face:                  Appears normal         Kidneys:                Appear normal                         (orbits and profile)  Lips:                  Appears normal         Bladder:                Appears normal  Thoracic:              Appears normal         Spine:                  Limited views                                                                        appear normal  Heart:                 Appears normal         Upper Extremities:      Appears normal                         (4CH, axis, and                         situs)  RVOT:                  Appears normal         Lower Extremities:      Appears normal  LVOT:                  Not well visualized  Other:  Parents do not wish to know sex of fetus. Nasal bone  visualized.          Technically difficult due to fetal position. ---------------------------------------------------------------------- Cervix Uterus Adnexa  Cervix  Length:           3.01  cm. ---------------------------------------------------------------------- Comments  Kirsten Hunt is a 34 year old gravida 3 para 2 currently at 25  weeks  and 0 days.  She was seen in consultation today due  to placenta previa with vaginal bleeding.  The patient was  admitted to the hospital last night after she went to the  bathroom and had some bright red blood in the toilet.  She  has a known placenta previa that was noted during her earlier  ultrasounds.  This is the first time that she has experienced  vaginal bleeding in her current pregnancy.  She denies  feeling any abdominal pain, cramping, or contractions.  This  is an IVF pregnancy.  The patient reports that she had prenatal genetic screening  as part of the IVF process that showed normal chromosomes.  She reports that she had a normal fetal anatomy scan  performed in your office.  Her blood type is A positive.  Her obstetrical history includes two prior uncomplicated full-  term vaginal deliveries.  Other than hypothyroidism, she denies any other significant  past medical or surgical history.  Due to vaginal bleeding with placenta previa, she was  admitted to the hospital and given a course of antenatal  corticosteroids.  Today, the patient reports that her vaginal  bleeding has resolved.  She reports feeling positive fetal  movements.  An ultrasound performed today shows that the fetus is in the  vertex presentation.  The overall EFW measured 1 pound 12  ounces (55th percentile for her gestational age).  There was  normal amniotic fluid noted.  The distal edge of the normal-  appearing anterior placenta appears to be covering the  internal cervical os.  The majority of the placenta appears to  be away from the cervix.  There were no sonographic signs of  placenta accreta noted.  The views of the fetal anatomy were limited today due to the  fetal position.  The implications and management of placenta previa was  discussed with the patient and her husband today.  They  were advised that as only the distal edge of the anterior  placenta is covering the internal cervical os, I believe that  there probably is  a greater than 90% chance that the  placenta previa will resolve later in her pregnancy.  She  should continue to be followed with serial ultrasound exams  every 4 to 5 weeks in your office to assess the fetal growth  and placental location.  These ultrasounds may be performed  in your office.  They understand that should the placenta previa persist  closer to the time of delivery, that a cesarean delivery will be  recommended at 37 weeks.  We will be happy to see her for  another ultrasound later in her pregnancy should there be  any further questions regarding the placental location.  As the patient is currently stable and no longer experiencing  any vaginal bleeding, consideration may be given for  discharge home on modified bedrest later this evening.  The  second dose of antenatal corticosteroids may be given early  so that she may be discharged at a reasonable hour.  Pelvic  rest was advised.  At the end of the consultation, the patient and her husband  stated that all their questions  have been answered to their  complete satisfaction. ----------------------------------------------------------------------                   Ma Rings, MD Electronically Signed Final Report   11/23/2019 04:15 pm ----------------------------------------------------------------------    MAU Course  Procedures  MDM -VB in known setting of placenta previa -RH positive -minimal active bleeding on exam -digital exam not performed -consulted with Dr. Earlene Plater, who agrees with plan for admission -called and spoke with Dr. Rana Snare who agrees with plan for admission and requests basic orders to be entered by MAU Provider, including MFM Consultation -EFM: reactive       -baseline: 140       -variability: moderate       -accels: present, 15x15       -decels: absent       -TOCO: no ctx -admit to [facility]        Orders Placed This Encounter  Procedures  . Respiratory Panel by RT PCR (Flu A&B, Covid) -  Nasopharyngeal Swab    Standing Status:   Standing    Number of Occurrences:   1    Order Specific Question:   Is this test for diagnosis or screening    Answer:   Screening    Order Specific Question:   Symptomatic for COVID-19 as defined by CDC    Answer:   No    Order Specific Question:   Hospitalized for COVID-19    Answer:   No    Order Specific Question:   Admitted to ICU for COVID-19    Answer:   No    Order Specific Question:   Previously tested for COVID-19    Answer:   Yes    Order Specific Question:   Resident in a congregate (group) care setting    Answer:   Unknown    Order Specific Question:   Employed in healthcare setting    Answer:   Unknown    Order Specific Question:   Pregnant    Answer:   Yes    Order Specific Question:   Has patient completed COVID vaccination(s) (2 doses of Pfizer/Moderna 1 dose of [employer])    Answer:   Unknown  . CBC on admission    Standing Status:   Standing    Number of Occurrences:   1  . OB RESULTS CONSOLE RPR    This external order was created through the Results Console.  [facility] OB RESULTS CONSOLE HIV antibody    This external order was created through the Results Console.  [facility] Kitchen OB RESULTS CONSOLE Rubella Antibody    This external order was created through the Results Console.  Marland Kitchen OB RESULTS CONSOLE Hepatitis B surface antigen    This external order was created through the Results Console.  Gavin Potters physician (specify)    Standing Status:   Standing    Number of Occurrences:   20    Order Specific Question:   Notify Physician    Answer:   for pulse less than 60 or greater than 120    Order Specific Question:   Notify Physician    Answer:   for respiratory rate less than 12 or greater than 28    Order Specific Question:   Notify Physician    Answer:   for temperature greater than 100.4    Order Specific Question:   Notify Physician    Answer:   for urinary  output less than 30 ml/hr  Order Specific Question:   Notify Physician    Answer:   for systolic BP less than 80 or greater than 140    Order Specific Question:   Notify Physician    Answer:   for diastolic BP less than 40 or greater than 90  . Vital signs    While awake, respect sleep.    Standing Status:   Standing    Number of Occurrences:   1  . Defer vaginal exam for vaginal bleeding or PROM <37 weeks    Standing Status:   Standing    Number of Occurrences:   1  . Initiate Oral Care Protocol    Standing Status:   Standing    Number of Occurrences:   1  . Initiate Carrier Fluid Protocol    Standing Status:   Standing    Number of Occurrences:   1  . SCDs    Standing Status:   Standing    Number of Occurrences:   1    Order Specific Question:   Laterality    Answer:   Bilateral  . Fetal monitoring    Standing Status:   Standing    Number of Occurrences:   1  . Full code    Standing Status:   Standing    Number of Occurrences:   1  . Consult to Maternal Fetal Care    Standing Status:   Standing    Number of Occurrences:   1    Order Specific Question:   Date Requested    Answer:   11/28/2019    Order Specific Question:   [place] Study Requested:    Answer:   OB Limited    Order Specific Question:   Other Study Requested:    Answer:   None    Order Specific Question:   Number of Fetus    Answer:   One    Order Specific Question:   Consult Type:    Answer:   [facility] Physician Bedside Consult    Order Specific Question:   Indication for Consult    Answer:   bleeding with placenta previa  . Type and screen [facility]    [facility]     Standing Status:   Standing    Number of Occurrences:   1  . Place in observation (patient's expected length of stay will be less than 2 midnights)    Standing Status:   Standing    Number of Occurrences:   1    Order Specific  Question:   Hospital Area    Answer:   [facility] [[id number]]    Order Specific Question:   Level of Care    Answer:   Antepartum [20]    Order Specific Question:   Covid Evaluation    Answer:   Asymptomatic Screening Protocol (No Symptoms)    Order Specific Question:   Diagnosis    Answer:   Placenta previa [161096]    Order Specific Question:   Admitting Physician    Answer:   Rana Snare, Noam Karaffa [[id number]]    Order Specific Question:   Attending Physician    Answer:   Rana Snare, Ledora Delker [[id number]]      Meds ordered this encounter  Medications  . acetaminophen (TYLENOL) tablet 650 mg  . zolpidem (AMBIEN) tablet 5 mg  . docusate sodium (COLACE) capsule 100 mg  . calcium carbonate (TUMS - dosed in mg elemental calcium) chewable tablet 400 mg  of elemental calcium  . prenatal multivitamin tablet 1 tablet   Assessment and Plan   1. Vaginal bleeding in pregnancy   2. Placenta previa in second trimester   3. Blood type, Rh positive   4. NST (non-stress test) reactive     -admit to [facility]  Odie Sera Nugent 11/27/2019, 5:05 PM           I spoke with Kirsten Hunt and her husband.  Her bleeding has improved to spotting only.  We reviewed her previous admission and our concerns and plan of care Will repeat US tomorrow and let Dr Parke Poisson know shes been readmitted.  She has already completed BMZ series so would not repeat at this time.  The FHR looks reassuring.  Will check CBC and get IV Access and monitor 1 h q shift unless increase of bleeding or ctxs. Discussed we normally want 1 week of no bleeding before consider another attempt at outpatient management. DL

## 2019-11-27 NOTE — Plan of Care (Signed)
  Problem: Education: Goal: Knowledge of General Education information will improve Description: Including pain rating scale, medication(s)/side effects and non-pharmacologic comfort measures Outcome: Completed/Met

## 2019-11-27 NOTE — MAU Provider Note (Signed)
History     CSN: [id number]  Arrival date and time: 11/27/19 1500   First Provider Initiated Contact with Patient 11/27/19 1537      Chief Complaint  Patient presents with  . Vaginal Bleeding   Ms. Kirsten Hunt is a 34 y.o. [id number] at [redacted]w[redacted]d who presents to MAU for vaginal bleeding which began about 1 hour ago. Patient has known placenta previa and has already been admitted for this recently for about 24 hours.  Passing blood clots? no Blood soaking clothes? no Lightheaded/dizzy? no Significant pelvic pain or cramping/ctx? no Passed any tissue? no  Current pregnancy problems? Placenta previa, IVF  Blood Type? A Positive Allergies? NKDA   OB History    Gravida  3   Para  2   Term  2   Preterm      AB      Living  2     SAB      TAB      Ectopic      Multiple  0   Live Births  2           Past Medical History:  Diagnosis Date  . Complication of anesthesia   . History of vertigo   . Hx of varicella   . PONV (postoperative nausea and vomiting)     Past Surgical History:  Procedure Laterality Date  . WISDOM TOOTH EXTRACTION      Family History  Problem Relation Age of Onset  . Thyroid disease Mother   . Migraines Mother   . Thyroid disease Maternal Aunt   . Migraines Maternal Aunt   . Cancer Paternal Aunt   . Hypertension Paternal Grandmother   . Marfan syndrome Maternal Grandfather     Social History   Tobacco Use  . Smoking status: Never Smoker  . Smokeless tobacco: Never Used  Vaping Use  . Vaping Use: Never used  Substance Use Topics  . Alcohol use: Not Currently    Comment: Occasional wine  . Drug use: No    Allergies: No Known Allergies  Medications Prior to Admission  Medication Sig Dispense Refill Last Dose  . cholecalciferol (VITAMIN D3) 25 MCG (1000 UNIT) tablet Take 1,000 Units by mouth daily.   11/26/2019 at Unknown time  . levothyroxine (SYNTHROID) 25 MCG tablet Take 25 mcg by mouth daily before  breakfast.   11/27/2019 at Unknown time  . Prenatal Vit-Fe Fumarate-FA (PRENATAL MULTIVITAMIN) TABS tablet Take 1 tablet by mouth daily at 12 noon.   11/26/2019 at Unknown time    Review of Systems  Constitutional: Negative for chills, diaphoresis, fatigue and fever.  Eyes: Negative for visual disturbance.  Respiratory: Negative for shortness of breath.   Cardiovascular: Negative for chest pain.  Gastrointestinal: Negative for abdominal pain, constipation, diarrhea, nausea and vomiting.  Genitourinary: Positive for vaginal bleeding. Negative for dysuria, flank pain, frequency, pelvic pain, urgency and vaginal discharge.  Neurological: Negative for dizziness, weakness, light-headedness and headaches.   Physical Exam   Blood pressure 119/66, pulse (!) 106, temperature 98.5 F (36.9 C), temperature source Oral, resp. rate 16, height 5\' 6"  (1.676 m), weight 76.2 kg, SpO2 100 %, not currently breastfeeding.  Patient Vitals for the past 24 hrs:  BP Temp Temp src Pulse Resp SpO2 Height Weight  11/27/19 1520 119/66 98.5 F (36.9 C) Oral (!) 106 16 100 % -- --  11/27/19 1510 112/68 98.3 F (36.8 C) Oral 86 20 100 % -- --  11/27/19 1505 -- -- -- -- -- --  5\' 6"  (1.676 m) 76.2 kg   Physical Exam Vitals and nursing note reviewed. Exam conducted with a chaperone present.  Constitutional:      General: She is not in acute distress.    Appearance: Normal appearance. She is normal weight. She is not ill-appearing, toxic-appearing or diaphoretic.  HENT:     Head: Normocephalic and atraumatic.  Pulmonary:     Effort: Pulmonary effort is normal.  Genitourinary:    General: Normal vulva.     Labia:        Right: No rash, tenderness or lesion.        Left: No rash, tenderness or lesion.      Vagina: Bleeding present.     Cervix: No cervical motion tenderness, discharge, friability, lesion or erythema.     Comments: Small amount of blood present in vaginal vault, bright red. Minimal active  bleeding coming from cervical os. Neurological:     Mental Status: She is alert and oriented to person, place, and time.  Psychiatric:        Mood and Affect: Mood normal.        Behavior: Behavior normal.        Thought Content: Thought content normal.        Judgment: Judgment normal.    Results for orders placed or performed during the hospital encounter of 11/27/19 (from the past 24 hour(s))  Type and screen [facility]     Status: None (Preliminary result)   Collection Time: 11/27/19  4:38 PM  Result Value Ref Range   ABO/RH(D) PENDING    Antibody Screen PENDING    Sample Expiration      11/30/2019,2359 Performed at [facility], [address] [place], [place]     [address] MFM OB COMP + 14 WK  Result Date: 11/23/2019 ----------------------------------------------------------------------  OBSTETRICS REPORT                       (Signed Final 11/23/2019 04:15 pm) ---------------------------------------------------------------------- Patient Info  ID #:       11/25/2019                          D.O.B.:  [birth year] (34 yrs)  Name:       Kirsten Hunt                    Visit Date: 11/23/2019 10:42 am ---------------------------------------------------------------------- Performed By  Attending:        11/25/2019 MD         Referred By:      [facility]                                                             Care  Performed By:     [facility]        [facility] ---------------------------------------------------------------------- Orders  #  Description                           Code        Ordered By  1  [place] MFM OB COMP + 14 WK                76805.01    YU FANG ----------------------------------------------------------------------  #  Order #                     Accession #                Episode #  1  [id number]                   [id number]                  [id number] ---------------------------------------------------------------------- Indications  Placenta previa specified as without           O44.02  hemorrhage, second trimester  Vaginal bleeding in pregnancy, second          O46.92  trimester  [redacted] weeks gestation of pregnancy                Z3A.25  Encounter for antenatal screening for          Z36.3  malformations ---------------------------------------------------------------------- Fetal Evaluation  Num Of Fetuses:         1  Fetal Heart Rate(bpm):  149  Cardiac Activity:       Observed  Presentation:           Cephalic  Placenta:               Anterior previa  P. Cord Insertion:      Visualized  Amniotic Fluid  AFI FV:      Within normal limits                              Largest Pocket(cm)                              5.79 ---------------------------------------------------------------------- Biometry  BPD:        64  mm     G. Age:  25w 6d         73  %    CI:         73.6   %    70 - 86                                                          FL/HC:      19.2   %    18.7 - 20.3  HC:       237   mm     G. Age:  25w 5d         56  %    HC/AC:      1.14        1.04 - 1.22  AC:      207.9  mm     G. Age:  25w 2d         52  %  FL/BPD:     71.3   %    71 - 87  FL:       45.6  mm     G. Age:  25w 1d         39  %    FL/AC:      21.9   %    20 - 24  HUM:      42.6  mm     G. Age:  25w 4d         57  %  CER:      30.5  mm     G. Age:  26w 4d         88  %  LV:        5.3  mm  CM:        6.4  mm  Est. FW:     797  gm    1 lb 12 oz      55  % ---------------------------------------------------------------------- Gestational Age  Clinical EDD:  25w 0d                                        EDD:   03/07/20  U/S Today:     25w 4d                                        EDD:   03/03/20  Best:          25w 0d     Det. By:  Clinical EDD             EDD:   03/07/20 ---------------------------------------------------------------------- Anatomy  Cranium:               Appears  normal         Aortic Arch:            Appears normal  Cavum:                 Appears normal         Ductal Arch:            Not well visualized  Ventricles:            Appears normal         Diaphragm:              Appears normal  Choroid Plexus:        Appears normal         Stomach:                Appears normal, left                                                                        sided  Cerebellum:            Appears normal         Abdomen:                Appears normal  Posterior Fossa:  Appears normal         Abdominal Wall:         Appears nml (cord                                                                        insert, abd wall)  Nuchal Fold:           Not applicable (>20    Cord Vessels:           Appears normal ([redacted]                         wks GA)                                        vessel cord)  Face:                  Appears normal         Kidneys:                Appear normal                         (orbits and profile)  Lips:                  Appears normal         Bladder:                Appears normal  Thoracic:              Appears normal         Spine:                  Limited views                                                                        appear normal  Heart:                 Appears normal         Upper Extremities:      Appears normal                         (4CH, axis, and                         situs)  RVOT:                  Appears normal         Lower Extremities:      Appears normal  LVOT:                  Not well visualized  Other:  Parents do not wish to know sex of fetus. Nasal bone  visualized.          Technically difficult due to fetal position. ---------------------------------------------------------------------- Cervix Uterus Adnexa  Cervix  Length:           3.01  cm. ---------------------------------------------------------------------- Comments  Kirsten Hunt is a 34 year old gravida 3 para 2 currently at 25  weeks and 0 days.  She was seen in  consultation today due  to placenta previa with vaginal bleeding.  The patient was  admitted to the hospital last night after she went to the  bathroom and had some bright red blood in the toilet.  She  has a known placenta previa that was noted during her earlier  ultrasounds.  This is the first time that she has experienced  vaginal bleeding in her current pregnancy.  She denies  feeling any abdominal pain, cramping, or contractions.  This  is an IVF pregnancy.  The patient reports that she had prenatal genetic screening  as part of the IVF process that showed normal chromosomes.  She reports that she had a normal fetal anatomy scan  performed in your office.  Her blood type is A positive.  Her obstetrical history includes two prior uncomplicated full-  term vaginal deliveries.  Other than hypothyroidism, she denies any other significant  past medical or surgical history.  Due to vaginal bleeding with placenta previa, she was  admitted to the hospital and given a course of antenatal  corticosteroids.  Today, the patient reports that her vaginal  bleeding has resolved.  She reports feeling positive fetal  movements.  An ultrasound performed today shows that the fetus is in the  vertex presentation.  The overall EFW measured 1 pound 12  ounces (55th percentile for her gestational age).  There was  normal amniotic fluid noted.  The distal edge of the normal-  appearing anterior placenta appears to be covering the  internal cervical os.  The majority of the placenta appears to  be away from the cervix.  There were no sonographic signs of  placenta accreta noted.  The views of the fetal anatomy were limited today due to the  fetal position.  The implications and management of placenta previa was  discussed with the patient and her husband today.  They  were advised that as only the distal edge of the anterior  placenta is covering the internal cervical os, I believe that  there probably is a greater than 90% chance  that the  placenta previa will resolve later in her pregnancy.  She  should continue to be followed with serial ultrasound exams  every 4 to 5 weeks in your office to assess the fetal growth  and placental location.  These ultrasounds may be performed  in your office.  They understand that should the placenta previa persist  closer to the time of delivery, that a cesarean delivery will be  recommended at 37 weeks.  We will be happy to see her for  another ultrasound later in her pregnancy should there be  any further questions regarding the placental location.  As the patient is currently stable and no longer experiencing  any vaginal bleeding, consideration may be given for  discharge home on modified bedrest later this evening.  The  second dose of antenatal corticosteroids may be given early  so that she may be discharged at a reasonable hour.  Pelvic  rest was advised.  At the end of the consultation, the patient and her husband  stated that all their questions  have been answered to their  complete satisfaction. ----------------------------------------------------------------------                   Ma Rings, MD Electronically Signed Final Report   11/23/2019 04:15 pm ----------------------------------------------------------------------   MAU Course  Procedures  MDM -VB in known setting of placenta previa -RH positive -minimal active bleeding on exam -digital exam not performed -consulted with Dr. Earlene Plater, who agrees with plan for admission -called and spoke with Dr. Rana Snare who agrees with plan for admission and requests basic orders to be entered by MAU Provider, including MFM Consultation -EFM: reactive       -baseline: 140       -variability: moderate       -accels: present, 15x15       -decels: absent       -TOCO: no ctx -admit to [facility]   Orders Placed This Encounter  Procedures  . Respiratory Panel by RT PCR (Flu A&B, Covid) - Nasopharyngeal Swab    Standing Status:    Standing    Number of Occurrences:   1    Order Specific Question:   Is this test for diagnosis or screening    Answer:   Screening    Order Specific Question:   Symptomatic for COVID-19 as defined by CDC    Answer:   No    Order Specific Question:   Hospitalized for COVID-19    Answer:   No    Order Specific Question:   Admitted to ICU for COVID-19    Answer:   No    Order Specific Question:   Previously tested for COVID-19    Answer:   Yes    Order Specific Question:   Resident in a congregate (group) care setting    Answer:   Unknown    Order Specific Question:   Employed in healthcare setting    Answer:   Unknown    Order Specific Question:   Pregnant    Answer:   Yes    Order Specific Question:   Has patient completed COVID vaccination(s) (2 doses of Pfizer/Moderna 1 dose of [employer])    Answer:   Unknown  . CBC on admission    Standing Status:   Standing    Number of Occurrences:   1  . OB RESULTS CONSOLE RPR    This external order was created through the Results Console.  Marland Kitchen OB RESULTS CONSOLE HIV antibody    This external order was created through the Results Console.  Marland Kitchen OB RESULTS CONSOLE Rubella Antibody    This external order was created through the Results Console.  Marland Kitchen OB RESULTS CONSOLE Hepatitis B surface antigen    This external order was created through the Results Console.  Gavin Potters physician (specify)    Standing Status:   Standing    Number of Occurrences:   20    Order Specific Question:   Notify Physician    Answer:   for pulse less than 60 or greater than 120    Order Specific Question:   Notify Physician    Answer:   for respiratory rate less than 12 or greater than 28    Order Specific Question:   Notify Physician    Answer:   for temperature greater than 100.4    Order Specific Question:   Notify Physician    Answer:   for urinary output less than 30 ml/hr    Order Specific Question:  Notify Physician    Answer:   for systolic BP less than  80 or greater than 140    Order Specific Question:   Notify Physician    Answer:   for diastolic BP less than 40 or greater than 90  . Vital signs    While awake, respect sleep.    Standing Status:   Standing    Number of Occurrences:   1  . Defer vaginal exam for vaginal bleeding or PROM <37 weeks    Standing Status:   Standing    Number of Occurrences:   1  . Initiate Oral Care Protocol    Standing Status:   Standing    Number of Occurrences:   1  . Initiate Carrier Fluid Protocol    Standing Status:   Standing    Number of Occurrences:   1  . SCDs    Standing Status:   Standing    Number of Occurrences:   1    Order Specific Question:   Laterality    Answer:   Bilateral  . Fetal monitoring    Standing Status:   Standing    Number of Occurrences:   1  . Full code    Standing Status:   Standing    Number of Occurrences:   1  . Consult to Maternal Fetal Care    Standing Status:   Standing    Number of Occurrences:   1    Order Specific Question:   Date Requested    Answer:   11/28/2019    Order Specific Question:   US Study Requested:    Answer:   OB Limited    Order Specific Question:   Other Study Requested:    Answer:   None    Order Specific Question:   Number of Fetus    Answer:   One    Order Specific Question:   Consult Type:    Answer:   [facility] Physician Bedside Consult    Order Specific Question:   Indication for Consult    Answer:   bleeding with placenta previa  . Type and screen [facility]    [facility]     Standing Status:   Standing    Number of Occurrences:   1  . Place in observation (patient's expected length of stay will be less than 2 midnights)    Standing Status:   Standing    Number of Occurrences:   1    Order Specific Question:   Hospital [facility]    Answer[facility] [[id number][facility]    Order Specific Question:   Level of Care    Answer:   Antepartum [20]    Order Specific Question:   Covid  Evaluation    Answer:   Asymptomatic Screening Protocol (No Symptoms)    Order Specific Question:   Diagnosis    Answer:   Placenta previa [161096][203273]    Order Specific Question:   Admitting Physician    Answer:   Rana SnareLOWE, DAVID [[id number]]    Order Specific Question:   Attending Physician    Answer:   Rana SnareLOWE, DAVID [[id number]]   Meds ordered this encounter  Medications  . acetaminophen (TYLENOL) tablet 650 mg  . zolpidem (AMBIEN) tablet 5 mg  . docusate sodium (COLACE) capsule 100 mg  . calcium carbonate (TUMS - dosed in mg elemental calcium) chewable tablet 400 mg of elemental calcium  . prenatal multivitamin  tablet 1 tablet   Assessment and Plan   1. Vaginal bleeding in pregnancy   2. Placenta previa in second trimester   3. Blood type, Rh positive   4. NST (non-stress test) reactive     -admit to [facility]  Odie Sera Esequiel Kleinfelter 11/27/2019, 5:05 PM

## 2019-11-27 NOTE — MAU Note (Addendum)
Presents with c/o VB that began today @ approximately 1430.  States felt VB, and then  placed sanitary napkin on.  Endorses +FM. Reports has placenta previa and was discharged 3 days ago secondary 1st vaginal bleed.

## 2019-11-28 ENCOUNTER — Observation Stay: Payer: BC Managed Care – PPO

## 2019-11-28 ENCOUNTER — Observation Stay (HOSPITAL_BASED_OUTPATIENT_CLINIC_OR_DEPARTMENT_OTHER): Payer: BC Managed Care – PPO

## 2019-11-28 DIAGNOSIS — O4412 Placenta previa with hemorrhage, second trimester: Secondary | ICD-10-CM | POA: Diagnosis not present

## 2019-11-28 DIAGNOSIS — Z3A25 25 weeks gestation of pregnancy: Secondary | ICD-10-CM

## 2019-11-28 DIAGNOSIS — Z3689 Encounter for other specified antenatal screening: Secondary | ICD-10-CM

## 2019-11-28 DIAGNOSIS — O4402 Placenta previa specified as without hemorrhage, second trimester: Secondary | ICD-10-CM | POA: Diagnosis not present

## 2019-11-28 MED ORDER — LEVOTHYROXINE SODIUM 25 MCG PO TABS
25.0000 ug | ORAL_TABLET | Freq: Every day | ORAL | Status: DC
  Administered 2019-11-28 – 2019-12-03 (×6): 25 ug via ORAL
  Filled 2019-11-28 (×6): qty 1

## 2019-11-28 NOTE — Consult Note (Signed)
MFM Consult Note  Kirsten Hunt is a 34 year old gravida 3 para 2 currently at 25 weeks and 5 days.  She was seen in consultation today due to vaginal bleeding in the setting of placenta previa.  The patient was admitted last week for the same indication during which time she received a complete course of antenatal corticosteroids.    The patient reports that she was dying her hair yesterday when she started to experience vaginal bleeding.  She had been on modified bedrest at home. She denies feeling any contractions or lower abdominal cramping.  Her bleeding has resolved since being admitted to the hospital.  She had an ultrasound performed today that shows that the fetus is in the vertex presentation with normal amniotic fluid.  A placenta previa continues to be noted on today's exam.  Her hemoglobin and hematocrit are within normal limits.  Her blood type is A positive.  Her fetal heart rate tracing is reactive.  There are no contractions noted on the toco.  Due to recurrent bleeding in the setting of placenta previa, it may be reasonable for the patient to be observed in the hospital for the next week or so.  As she received a complete course of antenatal corticosteroids less than 7 days ago, another steroid course is not recommended at this time.  The patient was advised that should she not experience any further episodes of vaginal bleeding during this hospitalization, that she may be discharged home on modified bedrest after about a week.    She was reassured that most cases of placenta previa will resolve later in pregnancy and that she will most likely be able to attempt a vaginal delivery should the placenta previa resolve.    She should be followed with serial ultrasound exams to assess the placental location and to follow the fetal growth.  She understands that should placenta previa persist later in her pregnancy, that a cesarean delivery will be recommended at around 37 weeks.  The  patient should receive a rescue course of antenatal corticosteroids if delivery is indicated prior to 34 weeks and it has been more than 7 days or more since she received the initial course.    She should receive magnesium sulfate for fetal neuroprotection should she require delivery at 32 weeks or less.  Magnesium sulfate may also be indicated should she start to experience frequent contractions.  At the end of the consultation, the patient stated that all of her questions have been answered to her complete satisfaction.    Thank you for referring this patient for a Maternal-Fetal Medicine consultation.

## 2019-11-28 NOTE — Progress Notes (Signed)
Antepartum Progress Note  S: Kirsten Hunt is a 34 y.o. female at [redacted]w[redacted]d that is admitted to [facility] for vaginal bleeding in the setting of placenta previa.  This is her second episode of bleeding.  She reports no bleeding since admission. She denies abdominal pain, contractions, LOF.  She reports good fetal movement.  O:   Blood pressure (!) 101/58, pulse 74, temperature 98.2 F (36.8 C), temperature source Oral, resp. rate 18, height 5\' 6"  (1.676 m), weight 76.2 kg, SpO2 99 %, not currently breastfeeding. Exam Physical Exam  Gen: alert, no distress Resp: nonlabored breathing CV: no peripheral edema Ab: soft, gravid, nontender Ext: no evidence of DVT  HGB 12.1  Assessment/Plan: . Inpatient mgmt of placenta previa, here with second bleed.  Currently stable, no bleeding. . Continuous EFM and toco . Repeat MFM today and MFM consult . S/p BMZ 11/18-19.  Mag gtt if concern for imminent delivery for neuroprotection . Diet: reg . DVT Ppx: SCDs  04-30-1990 11/28/2019, 8:22 AM

## 2019-11-29 DIAGNOSIS — Z20822 Contact with and (suspected) exposure to covid-19: Secondary | ICD-10-CM | POA: Diagnosis present

## 2019-11-29 DIAGNOSIS — O4412 Placenta previa with hemorrhage, second trimester: Secondary | ICD-10-CM | POA: Diagnosis present

## 2019-11-29 DIAGNOSIS — O09522 Supervision of elderly multigravida, second trimester: Secondary | ICD-10-CM | POA: Diagnosis not present

## 2019-11-29 DIAGNOSIS — Z3A25 25 weeks gestation of pregnancy: Secondary | ICD-10-CM | POA: Diagnosis not present

## 2019-11-29 NOTE — Progress Notes (Signed)
No c/o.  No more VB since on admission; 2nd bleed with previa on 11/23.  Active FM.  VSS. Af. Gen: A&O x 3 Abd: soft, NT Ext: no c/c/e  NST reactive  U/S yesterday with normal AFI, anterior previa  34yo G3P2002 at [redacted]w[redacted]d admitted with second bleed; previa -S/P BMZ on 11/18 and 11/19 -S/P MFM; continue in house monitoring x 7 days if no additional bleeding -D/C continuous monitoring; NST q shift  [name], DO

## 2019-11-30 NOTE — Progress Notes (Signed)
No c/o.  No more VB since on admission; 2nd bleed with previa on 11/23.  Active FM.  VSS. Af. Gen: A&O x 3 Abd: soft, NT Ext: no c/c/e  NST reactive   34yo G3P2002 at [redacted]w[redacted]d admitted with second bleed; previa -S/P BMZ on 11/18 and 11/19 -S/P MFM; continue in house monitoring x 7 days if no additional bleeding -NST q shift  [name], DO

## 2019-12-01 LAB — TYPE AND SCREEN
ABO/RH(D): A POS
Antibody Screen: NEGATIVE

## 2019-12-01 NOTE — Progress Notes (Signed)
No c/o.  No more VB since on admission; 2nd bleed with previa on 11/23.  Active FM.  VSS. Af. Gen: A&O x 3 Abd: soft, NT Ext: no c/c/e  NST reactive   34yo G3P2002 at [redacted]w[redacted]d admitted with second bleed; previa -S/P BMZ on 11/18 and 11/19 -S/P MFM; continue in house monitoring x 7 days if no additional bleeding -NST q shift  [name], DO

## 2019-12-02 NOTE — Progress Notes (Signed)
No c/o. No more VB since on admission; 2nd bleed with previa on 11/23. Active FM.  VSS. Af. Gen: A&O x 3 Abd: soft, NT Ext: no c/c/e  NST reactive   34yo G3P2002 at [redacted]w[redacted]d admitted with second bleed; previa -S/P BMZ on 11/18 and 11/19 -S/P MFM; continue in house monitoring x 7 days if no additional bleeding; will consider d/c home tomorrow. -NST q shift  [name], DO

## 2019-12-03 MED ORDER — SODIUM CHLORIDE 0.9% FLUSH: 3.0000 mL | Freq: Two times a day (BID) | INTRAVENOUS | Status: DC

## 2019-12-03 NOTE — Lactation Note (Signed)
Lactation Consultation Note  Patient Name: Kirsten Hunt [id number]'B Date: 12/03/2019   [facility] in to visit with Mom.  Mom is 26weeks and has placenta previa on bedrest.  Mom to be discharged home today and is very happy.   Talked about the options of breastfeeding with this baby.  Mom has a poor breastfeeding history including mastitis and difficult latching.   Mom aware of lactation support and will ask for help prn.   [name] 12/03/2019, 9:32 AM

## 2019-12-03 NOTE — Discharge Instructions (Signed)
Placenta Previa Placenta previa is a condition in which the placenta implants in the lower part of the uterus in pregnant women. The placenta either partially or completely covers the opening to the cervix. This is a problem because the baby must pass through the cervix during delivery. There are three types of placenta previa:  Marginal placenta previa. The placenta reaches within an inch (2.5 cm) of the cervical opening but does not cover it.  Partial placenta previa. The placenta covers part of the cervical opening.  Complete placenta previa. The placenta covers the entire cervical opening. If the previa is marginal or partial and it is diagnosed in the first half of pregnancy, the placenta may move into a normal position as the pregnancy progresses and may no longer cover the cervix. It is important to keep all prenatal visits with your health care provider so you can be more closely monitored. What are the causes? The cause of this condition is not known. What increases the risk? This condition is more likely to develop in women who:  Are carrying more than one baby (multiples).  Have an abnormally shaped uterus.  Have scars on the lining of the uterus.  Have had surgeries involving the uterus, such as a cesarean delivery.  Have delivered a baby before.  Have a history of placenta previa.  Have smoked or used cocaine during pregnancy.  Are age 35 or older during pregnancy. What are the signs or symptoms? The main symptom of this condition is sudden, painless vaginal bleeding during the second half of pregnancy. The amount of bleeding can be very light at first, and it usually stops on its own. Heavier bleeding episodes may also happen. Some women with placenta previa may have no bleeding at all. How is this diagnosed?  This condition is diagnosed: ? From an ultrasound. This test uses sound waves to find where the placenta is located before you have any bleeding  episodes. ? During a checkup after vaginal bleeding is noticed.  If you are diagnosed with a partial or complete previa, digital exams with fingers will generally be avoided. Your health care provider will still perform a speculum exam.  If you did not have an ultrasound during your pregnancy, placenta previa may not be diagnosed until bleeding occurs during labor. How is this treated? Treatment for this condition may include:  Decreased activity.  Bed rest at home or in the hospital.  Pelvic rest. Nothing is placed inside the vagina during pelvic rest. This means not having sex and not using tampons or douches.  A blood transfusion to replace blood that you have lost (maternal blood loss).  A cesarean delivery. This may be performed if: ? The bleeding is heavy and cannot be controlled. ? The placenta completely covers the cervix.  Medicines to stop premature labor or to help the baby's lungs to mature. This treatment may be used if you need delivery before your pregnancy is full-term. Your treatment will be decided based on:  How much you are bleeding, or whether the bleeding has stopped.  How far along you are in your pregnancy.  The condition of your baby.  The type of placenta previa that you have.  Follow these instructions at home:  Get plenty of rest and lessen activity as told by your health care provider.  Stay on bed rest for as long as told by your health care provider.  Do not have sex, use tampons, use a douche, or place anything inside of   your vagina if your health care provider recommended pelvic rest.  Take over-the-counter and prescription medicines as told by your health care provider.  Keep all follow-up visits as told by your health care provider. This is important. Get help right away if:  You have vaginal bleeding, even if in small amounts and even if you have no pain.  You have cramping or regular contractions.  You have pain in your abdomen or  your lower back.  You have a feeling of increased pressure in your pelvis.  You have increased watery or bloody mucus from the vagina. This information is not intended to replace advice given to you by your health care provider. Make sure you discuss any questions you have with your health care provider. Document Revised: 12/03/2016 Document Reviewed: 07/05/2015 Elsevier Patient Education  2020 [employer]  

## 2019-12-03 NOTE — Discharge Summary (Signed)
Physician Discharge Summary  Patient ID: Kirsten Hunt MRN: [id number] DOB/AGE: 35-Aug-1987 35 y.o.  Admit date: 11/27/2019 Discharge date: 12/03/2019  Admission Diagnoses:  Discharge Diagnoses:  Active Problems:   Placenta previa   Discharged Condition: good  Hospital Course: 34 yo [id number] presented @ 25.4 wga with second episode of vaginal bleeding in the setting of known placenta previa. Kirsten Hunt had been discharged approximately a week prior to this. Kirsten Hunt Had a small amount that was resolved without intervention on day of admisstion. An [place] was performed by MFM which was reassuring. MFM stated Kirsten Hunt could go home with outpatient observation on HD#7. Kirsten Hunt had already received BMZ last admission. ON HD#7 Kirsten Hunt remained without bleeding or cramping and was deemed stable for discharge.    Consults: MFM  Significant Diagnostic Studies: labs:  CBC    Component Value Date/Time   WBC 11.5 (H) 11/27/2019 1638   RBC 3.75 (L) 11/27/2019 1638   HGB 12.1 11/27/2019 1638   HCT 36.3 11/27/2019 1638   PLT 221 11/27/2019 1638   MCV 96.8 11/27/2019 1638   MCH 32.3 11/27/2019 1638   MCHC 33.3 11/27/2019 1638   RDW 12.5 11/27/2019 1638     Treatments: none  Discharge Exam: Blood pressure (!) 97/56, pulse 69, temperature 98.1 F (36.7 C), temperature source Oral, resp. rate 17, height 5\' 6"  (1.676 m), weight 76.2 kg, SpO2 99 %, not currently breastfeeding. General appearance: alert and cooperative Resp: NWOB Cardio: regular rate and rhythm GI: soft, non-tender; bowel sounds normal; no masses,  no organomegaly and gravid Extremities: extremities normal, atraumatic, no cyanosis or edema  GYN: No blood on pad  Disposition: Discharge disposition: 01-Home or Self Care       Discharge Instructions    Discharge activity:  Up to eat   Complete by: As directed    Discharge diet:  No restrictions   Complete by: As directed    Do not have sex or do anything that might make you have an  orgasm   Complete by: As directed    Fetal Kick Count:  Lie on our left side for one hour after a meal, and count the number of times your baby kicks.  If it is less than 5 times, get up, move around and drink some juice.  Repeat the test 30 minutes later.  If it is still less than 5 kicks in an hour, notify your doctor.   Complete by: As directed    Notify physician for a general feeling that "something is not right"   Complete by: As directed    Notify physician for increase or change in vaginal discharge   Complete by: As directed    Notify physician for intestinal cramps, with or without diarrhea, sometimes described as "gas pain"   Complete by: As directed    Notify physician for leaking of fluid   Complete by: As directed    Notify physician for low, dull backache, unrelieved by heat or Tylenol   Complete by: As directed    Notify physician for menstrual like cramps   Complete by: As directed    Notify physician for pelvic pressure   Complete by: As directed    Notify physician for uterine contractions.  These may be painless and feel like the uterus is tightening or the baby is  "balling up"   Complete by: As directed    Notify physician for vaginal bleeding   Complete by: As directed    PRETERM LABOR:  Includes  any of the follwing symptoms that occur between 20 - [redacted] weeks gestation.  If these symptoms are not stopped, preterm labor can result in preterm delivery, placing your baby at risk   Complete by: As directed      Allergies as of 12/03/2019      Reactions   Lactose Intolerance (gi) Other (See Comments)   Gas, stomach cramps      Medication List    TAKE these medications   calcium carbonate 500 MG chewable tablet Commonly known as: TUMS - dosed in mg elemental calcium Chew 1-2 tablets by mouth daily as needed for indigestion or heartburn.   cholecalciferol 25 MCG (1000 UNIT) tablet Commonly known as: VITAMIN D3 Take 1,000 Units by mouth daily.   docusate sodium  100 MG capsule Commonly known as: COLACE Take 100 mg by mouth every other day.   lactase 3000 units tablet Commonly known as: LACTAID Take 3,000 Units by mouth as needed (to prevent stomach cramps prior to a meal).   levothyroxine 25 MCG tablet Commonly known as: SYNTHROID Take 25 mcg by mouth daily before breakfast.   prenatal multivitamin Tabs tablet Take 1 tablet by mouth daily at 12 noon.        Signed: Ranae Pila 12/03/2019, 8:59 AM

## 2019-12-03 NOTE — Plan of Care (Signed)
  Problem: Clinical Measurements: Goal: Will remain free from infection Outcome: Adequate for Discharge Goal: Diagnostic test results will improve Outcome: Adequate for Discharge Goal: Cardiovascular complication will be avoided Outcome: Adequate for Discharge   Problem: Skin Integrity: Goal: Risk for impaired skin integrity will decrease Outcome: Adequate for Discharge   Problem: Clinical Measurements: Goal: Complications related to the disease process, condition or treatment will be avoided or minimized Outcome: Adequate for Discharge

## 2019-12-06 ENCOUNTER — Ambulatory Visit: Payer: BC Managed Care – PPO | Admitting: Family Medicine

## 2019-12-11 DIAGNOSIS — Z3480 Encounter for supervision of other normal pregnancy, unspecified trimester: Secondary | ICD-10-CM | POA: Diagnosis not present

## 2019-12-11 DIAGNOSIS — Z3A27 27 weeks gestation of pregnancy: Secondary | ICD-10-CM | POA: Diagnosis not present

## 2019-12-11 DIAGNOSIS — O4402 Placenta previa specified as without hemorrhage, second trimester: Secondary | ICD-10-CM | POA: Diagnosis not present

## 2019-12-11 DIAGNOSIS — Z23 Encounter for immunization: Secondary | ICD-10-CM | POA: Diagnosis not present

## 2019-12-22 ENCOUNTER — Other Ambulatory Visit: Payer: Self-pay

## 2019-12-22 ENCOUNTER — Inpatient Hospital Stay (HOSPITAL_COMMUNITY)
Admission: AD | Admit: 2019-12-22 | Discharge: 2019-12-27 | DRG: 833 | Disposition: A | Discharge status: Home / Self Care | Payer: BC Managed Care – PPO | Attending: Obstetrics and Gynecology | Admitting: Obstetrics and Gynecology

## 2019-12-22 ENCOUNTER — Encounter (HOSPITAL_COMMUNITY): Payer: Self-pay | Admitting: Obstetrics and Gynecology

## 2019-12-22 DIAGNOSIS — O4400 Placenta previa specified as without hemorrhage, unspecified trimester: Secondary | ICD-10-CM | POA: Diagnosis present

## 2019-12-22 DIAGNOSIS — O4413 Placenta previa with hemorrhage, third trimester: Secondary | ICD-10-CM | POA: Diagnosis not present

## 2019-12-22 DIAGNOSIS — D649 Anemia, unspecified: Secondary | ICD-10-CM | POA: Diagnosis present

## 2019-12-22 DIAGNOSIS — O4403 Placenta previa specified as without hemorrhage, third trimester: Secondary | ICD-10-CM | POA: Diagnosis not present

## 2019-12-22 DIAGNOSIS — O209 Hemorrhage in early pregnancy, unspecified: Secondary | ICD-10-CM

## 2019-12-22 DIAGNOSIS — O99013 Anemia complicating pregnancy, third trimester: Secondary | ICD-10-CM | POA: Diagnosis present

## 2019-12-22 DIAGNOSIS — Z3A29 29 weeks gestation of pregnancy: Secondary | ICD-10-CM

## 2019-12-22 DIAGNOSIS — Z20822 Contact with and (suspected) exposure to covid-19: Secondary | ICD-10-CM | POA: Diagnosis present

## 2019-12-22 DIAGNOSIS — Z6790 Unspecified blood type, Rh positive: Secondary | ICD-10-CM

## 2019-12-22 DIAGNOSIS — Z3689 Encounter for other specified antenatal screening: Secondary | ICD-10-CM | POA: Diagnosis not present

## 2019-12-22 LAB — TYPE AND SCREEN
ABO/RH(D): A POS
Antibody Screen: NEGATIVE

## 2019-12-22 LAB — RESP PANEL BY RT-PCR (FLU A&B, COVID) ARPGX2
Influenza A by PCR: NEGATIVE
Influenza B by PCR: NEGATIVE
SARS Coronavirus 2 by RT PCR: NEGATIVE

## 2019-12-22 LAB — CBC
HCT: 34.7 % — ABNORMAL LOW (ref 36.0–46.0)
Hemoglobin: 11.7 g/dL — ABNORMAL LOW (ref 12.0–15.0)
MCH: 32.4 pg (ref 26.0–34.0)
MCHC: 33.7 g/dL (ref 30.0–36.0)
MCV: 96.1 fL (ref 80.0–100.0)
Platelets: 199 10*3/uL (ref 150–400)
RBC: 3.61 MIL/uL — ABNORMAL LOW (ref 3.87–5.11)
RDW: 12.6 % (ref 11.5–15.5)
WBC: 9.6 10*3/uL (ref 4.0–10.5)
nRBC: 0 % (ref 0.0–0.2)

## 2019-12-22 MED ORDER — PRENATAL MULTIVITAMIN CH
1.0000 | ORAL_TABLET | Freq: Every day | ORAL | Status: DC
  Administered 2019-12-22 – 2019-12-26 (×5): 1 via ORAL
  Filled 2019-12-22 (×5): qty 1

## 2019-12-22 MED ORDER — LEVOTHYROXINE SODIUM 25 MCG PO TABS
25.0000 ug | ORAL_TABLET | ORAL | Status: DC
  Administered 2019-12-23 – 2019-12-27 (×4): 25 ug via ORAL
  Filled 2019-12-22 (×4): qty 1

## 2019-12-22 MED ORDER — LEVOTHYROXINE SODIUM 25 MCG PO TABS
50.0000 ug | ORAL_TABLET | ORAL | Status: DC
  Administered 2019-12-25: 07:00:00 50 ug via ORAL
  Filled 2019-12-22: qty 2

## 2019-12-22 MED ORDER — LACTATED RINGERS IV SOLN: INTRAVENOUS | Status: DC

## 2019-12-22 MED ORDER — CALCIUM CARBONATE ANTACID 500 MG PO CHEW: 2.0000 | CHEWABLE_TABLET | ORAL | Status: DC | PRN

## 2019-12-22 MED ORDER — ACETAMINOPHEN 325 MG PO TABS: 650.0000 mg | ORAL_TABLET | ORAL | Status: DC | PRN

## 2019-12-22 MED ORDER — DOCUSATE SODIUM 100 MG PO CAPS
100.0000 mg | ORAL_CAPSULE | Freq: Every day | ORAL | Status: DC
  Administered 2019-12-22 – 2019-12-26 (×5): 100 mg via ORAL
  Filled 2019-12-22 (×5): qty 1

## 2019-12-22 MED ORDER — BETAMETHASONE SOD PHOS & ACET 6 (3-3) MG/ML IJ SUSP
12.0000 mg | INTRAMUSCULAR | Status: AC
  Administered 2019-12-22 – 2019-12-23 (×2): 12 mg via INTRAMUSCULAR
  Filled 2019-12-22: qty 5

## 2019-12-22 MED ORDER — ZOLPIDEM TARTRATE 5 MG PO TABS: 5.0000 mg | ORAL_TABLET | Freq: Every evening | ORAL | Status: DC | PRN

## 2019-12-22 NOTE — H&P (Signed)
Ms. Kirsten Hunt is a 34 y.o. G3P2002 at [redacted]w[redacted]d who presents to MAU for known previa with bleeding starting about 1 hour ago. Patient reports she was sleeping and felt a "bubble" and then had bright red blood running down her legs. Patient has been previously hospitalized for bleeding with previa and had a recent US that showed no change in the position of the placenta. Patient denies contractions and reports good fetal movement.  Prenatal care provider? [facility]           OB History    Gravida  3   Para  2   Term  2   Preterm      AB      Living  2     SAB      IAB      Ectopic      Multiple  0   Live Births  2               Past Medical History:  Diagnosis Date  . Complication of anesthesia   . History of vertigo   . Hx of varicella   . PONV (postoperative nausea and vomiting)     Past Surgical History:  Procedure Laterality Date  . WISDOM TOOTH EXTRACTION           Family History  Problem Relation Age of Onset  . Thyroid disease Mother   . Migraines Mother   . Thyroid disease Maternal Aunt   . Migraines Maternal Aunt   . Cancer Paternal Aunt   . Hypertension Paternal Grandmother   . Marfan syndrome Maternal Grandfather     Social History        Tobacco Use  . Smoking status: Never Smoker  . Smokeless tobacco: Never Used  Vaping Use  . Vaping Use: Never used  Substance Use Topics  . Alcohol use: Not Currently    Comment: Occasional wine  . Drug use: No    Allergies:       Allergies  Allergen Reactions  . Lactose Intolerance (Gi) Other (See Comments)    Gas, stomach cramps           Medications Prior to Admission  Medication Sig Dispense Refill Last Dose  . cholecalciferol (VITAMIN D3) 25 MCG (1000 UNIT) tablet Take 1,000 Units by mouth daily.   12/21/2019 at Unknown time  . docusate sodium (COLACE) 100 MG capsule Take 100 mg by mouth every other day.   12/21/2019 at  Unknown time  . lactase (LACTAID) 3000 units tablet Take 3,000 Units by mouth as needed (to prevent stomach cramps prior to a meal).    Past Week at Unknown time  . levothyroxine (SYNTHROID) 25 MCG tablet Take 25 mcg by mouth daily before breakfast.   12/22/2019 at Unknown time  . Prenatal Vit-Fe Fumarate-FA (PRENATAL MULTIVITAMIN) TABS tablet Take 1 tablet by mouth daily at 12 noon.   12/21/2019 at Unknown time  . calcium carbonate (TUMS - DOSED IN MG ELEMENTAL CALCIUM) 500 MG chewable tablet Chew 1-2 tablets by mouth daily as needed for indigestion or heartburn.       Review of Systems  Constitutional: Negative for chills, diaphoresis, fatigue and fever.  Eyes: Negative for visual disturbance.  Respiratory: Negative for shortness of breath.   Cardiovascular: Negative for chest pain.  Gastrointestinal: Negative for abdominal pain, constipation, diarrhea, nausea and vomiting.  Genitourinary: Positive for vaginal bleeding. Negative for dysuria, flank pain, frequency, pelvic pain, urgency and vaginal discharge.  Neurological: Negative for dizziness, weakness, light-headedness and headaches.   Physical Exam   Blood pressure 109/75, pulse (!) 104, temperature 98.3 F (36.8 C), temperature source Oral, resp. rate 18, height 5\' 6"  (1.676 m), weight 79.6 kg, SpO2 100 %, not currently breastfeeding.  Patient Vitals for the past 24 hrs:  BP Temp Temp src Pulse Resp SpO2 Height Weight  12/22/19 0424 -- -- -- -- -- -- 5\' 6"  (1.676 m) 79.6 kg  12/22/19 0420 109/75 98.3 F (36.8 C) Oral (!) 104 18 100 % -- --   Physical Exam Vitals and nursing note reviewed. Exam conducted with a chaperone present.  Constitutional:      General: She is not in acute distress.    Appearance: Normal appearance. She is not ill-appearing, toxic-appearing or diaphoretic.  HENT:     Head: Normocephalic and atraumatic.  Pulmonary:     Effort: Pulmonary effort is normal.  Genitourinary:    General:  Normal vulva.     Labia:        Right: No rash, tenderness or lesion.        Left: No rash, tenderness or lesion.      Vagina: Bleeding present. No vaginal discharge.     Cervix: Cervical bleeding present. No friability.     Comments: On exam, small amount of bright red blood visualized on introduction of the speculum, able to be clear with single Fox swab, followed by ~3cm clot. No gush of bleeding behind the clot, but did have small amount of bright red bleeding coming from external os. Skin:    General: Skin is warm and dry.  Neurological:     Mental Status: She is alert and oriented to person, place, and time.  Psychiatric:        Mood and Affect: Mood normal.        Behavior: Behavior normal.        Thought Content: Thought content normal.        Judgment: Judgment normal.    Lab Results Last 24 Hours  No results found for this or any previous visit (from the past 24 hour(s)).     Imaging Results  MFM OB COMP + 14 WK  Result Date: 11/23/2019 ----------------------------------------------------------------------  OBSTETRICS REPORT                       (Signed Final 11/23/2019 04:15 pm) ---------------------------------------------------------------------- Patient Info  ID #:       11/25/2019                          D.O.B.:  [birth year] (34 yrs)  Name:       Kirsten Hunt                    Visit Date: 11/23/2019 10:42 am ---------------------------------------------------------------------- Performed By  Attending:        [name] MD         Referred By:      [facility]                                                             Care  Performed By:     [facility]  Location[facility] ---------------------------------------------------------------------- Orders  #  Description                           Code        Ordered By  1  [place] MFM OB COMP + 14 WK                [id number]     YU FANG ----------------------------------------------------------------------  #  Order #                     Accession #                Episode #  1  [id number]                   [id number]                 [id number] ---------------------------------------------------------------------- Indications  Placenta previa specified as without           O44.02  hemorrhage, second trimester  Vaginal bleeding in pregnancy, second          O46.92  trimester  [redacted] weeks gestation of pregnancy                Z3A.25  Encounter for antenatal screening for          Z36.3  malformations ---------------------------------------------------------------------- Fetal Evaluation  Num Of Fetuses:         1  Fetal Heart Rate(bpm):  149  Cardiac Activity:       Observed  Presentation:           Cephalic  Placenta:               Anterior previa  P. Cord Insertion:      Visualized  Amniotic Fluid  AFI FV:      Within normal limits                              Largest Pocket(cm)                              5.79 ---------------------------------------------------------------------- Biometry  BPD:        64  mm     G. Age:  25w 6d         73  %    CI:         73.6   %    70 - 86                                                          FL/HC:      19.2   %    18.7 - 20.3  HC:       237   mm  G. Age:  25w 5d         56  %    HC/AC:      1.14        1.04 - 1.22  AC:      207.9  mm     G. Age:  25w 2d         52  %    FL/BPD:     71.3   %    71 - 87  FL:       45.6  mm     G. Age:  25w 1d         39  %    FL/AC:      21.9   %    20 - 24  HUM:      42.6  mm     G. Age:  25w 4d         57  %  CER:      30.5  mm     G. Age:  26w 4d         88  %  LV:        5.3  mm  CM:        6.4  mm  Est. FW:     797  gm    1 lb 12 oz      55  % ---------------------------------------------------------------------- Gestational Age  Clinical EDD:  25w 0d                                        EDD:   03/07/20  U/S Today:     25w 4d                                         EDD:   03/03/20  Best:          25w 0d     Det. By:  Clinical EDD             EDD:   03/07/20 ---------------------------------------------------------------------- Anatomy  Cranium:               Appears normal         Aortic Arch:            Appears normal  Cavum:                 Appears normal         Ductal Arch:            Not well visualized  Ventricles:            Appears normal         Diaphragm:              Appears normal  Choroid Plexus:        Appears normal         Stomach:                Appears normal, left  sided  Cerebellum:            Appears normal         Abdomen:                Appears normal  Posterior Fossa:       Appears normal         Abdominal Wall:         Appears nml (cord                                                                        insert, abd wall)  Nuchal Fold:           Not applicable (>20    Cord Vessels:           Appears normal ([redacted]                         wks GA)                                        vessel cord)  Face:                  Appears normal         Kidneys:                Appear normal                         (orbits and profile)  Lips:                  Appears normal         Bladder:                Appears normal  Thoracic:              Appears normal         Spine:                  Limited views                                                                        appear normal  Heart:                 Appears normal         Upper Extremities:      Appears normal                         (4CH, axis, and                         situs)  RVOT:                  Appears normal  Lower Extremities:      Appears normal  LVOT:                  Not well visualized  Other:  Parents do not wish to know sex of fetus. Nasal bone visualized.          Technically difficult due to fetal position. ---------------------------------------------------------------------- Cervix Uterus Adnexa  Cervix   Length:           3.01  cm. ---------------------------------------------------------------------- Comments  Kirsten Hunt is a 34 year old gravida 3 para 2 currently at 25  weeks and 0 days.  She was seen in consultation today due  to placenta previa with vaginal bleeding.  The patient was  admitted to the hospital last night after she went to the  bathroom and had some bright red blood in the toilet.  She  has a known placenta previa that was noted during her earlier  ultrasounds.  This is the first time that she has experienced  vaginal bleeding in her current pregnancy.  She denies  feeling any abdominal pain, cramping, or contractions.  This  is an IVF pregnancy.  The patient reports that she had prenatal genetic screening  as part of the IVF process that showed normal chromosomes.  She reports that she had a normal fetal anatomy scan  performed in your office.  Her blood type is A positive.  Her obstetrical history includes two prior uncomplicated full-  term vaginal deliveries.  Other than hypothyroidism, she denies any other significant  past medical or surgical history.  Due to vaginal bleeding with placenta previa, she was  admitted to the hospital and given a course of antenatal  corticosteroids.  Today, the patient reports that her vaginal  bleeding has resolved.  She reports feeling positive fetal  movements.  An ultrasound performed today shows that the fetus is in the  vertex presentation.  The overall EFW measured 1 pound 12  ounces (55th percentile for her gestational age).  There was  normal amniotic fluid noted.  The distal edge of the normal-  appearing anterior placenta appears to be covering the  internal cervical os.  The majority of the placenta appears to  be away from the cervix.  There were no sonographic signs of  placenta accreta noted.  The views of the fetal anatomy were limited today due to the  fetal position.  The implications and management of placenta previa was  discussed with the  patient and her husband today.  They  were advised that as only the distal edge of the anterior  placenta is covering the internal cervical os, I believe that  there probably is a greater than 90% chance that the  placenta previa will resolve later in her pregnancy.  She  should continue to be followed with serial ultrasound exams  every 4 to 5 weeks in your office to assess the fetal growth  and placental location.  These ultrasounds may be performed  in your office.  They understand that should the placenta previa persist  closer to the time of delivery, that a cesarean delivery will be  recommended at 37 weeks.  We will be happy to see her for  another ultrasound later in her pregnancy should there be  any further questions regarding the placental location.  As the patient is currently stable and no longer experiencing  any vaginal bleeding, consideration may be given for  discharge home on modified bedrest later this evening.  The  second dose of antenatal corticosteroids may be given early  so that she may be discharged at a reasonable hour.  Pelvic  rest was advised.  At the end of the consultation, the patient and her husband  stated that all their questions have been answered to their  complete satisfaction. ----------------------------------------------------------------------                   Ma Rings, MD Electronically Signed Final Report   11/23/2019 04:15 pm ----------------------------------------------------------------------  [place] MFM OB LIMITED  Result Date: 11/28/2019 ----------------------------------------------------------------------  OBSTETRICS REPORT                       (Signed Final 11/28/2019 09:58 am) ---------------------------------------------------------------------- Patient Info  ID #:       [id number]                          D.O.B.:  02/19/1985 (34 yrs)  Name:       Kirsten Hunt              Visit Date: 11/28/2019 08:19 am  ---------------------------------------------------------------------- Performed By  Attending:        Lin Landsman      Referred By:      [facility]                    MD                                       Care  Performed By:     [facility]          Location[facility] ---------------------------------------------------------------------- Orders  #  Description                           Code        Ordered By  1  [place] MFM OB LIMITED                     40981.19    DAVID LOWE ----------------------------------------------------------------------  #  Order #                     Accession #                Episode #  1  [id number]                   [id number]                 [id number] ---------------------------------------------------------------------- Indications  Vaginal bleeding in pregnancy, second          O46.92  trimester  [redacted] weeks gestation of pregnancy                Z3A.25  Placenta previa specified as without           O44.02  hemorrhage, second trimester  Encounter for other specified  antenatal        Z36.89  screening ---------------------------------------------------------------------- Fetal Evaluation  Num Of Fetuses:         1  Fetal Heart Rate(bpm):  147  Cardiac Activity:       Observed  Presentation:           Cephalic  Placenta:               Anterior previa  P. Cord Insertion:      Previously Visualized  Amniotic Fluid  AFI FV:      Within normal limits                              Largest Pocket(cm)                              6 ---------------------------------------------------------------------- Gestational Age  Clinical EDD:  25w 5d                                        EDD:   03/07/20  Best:          25w 5d     Det. By:  Clinical EDD             EDD:   03/07/20 ---------------------------------------------------------------------- Anatomy  Thoracic:              Appears normal          LVOT:                   Appears normal  Heart:                 Appears normal         Stomach:                Appears normal, left                         (4CH, axis, and                                sided                         situs)  RVOT:                  Appears normal         Bladder:                Appears normal ---------------------------------------------------------------------- Cervix Uterus Adnexa  Cervix  Length:            2.5  cm.  Closed  Uterus  No abnormality visualized.  Right Ovary  No adnexal mass visualized.  Left Ovary  No adnexal mass visualized.  Cul De Sac  No free fluid seen.  Adnexa  No abnormality visualized. ---------------------------------------------------------------------- Impression  Limited exam due to vaginal bleeding for known placenta  previa (previous consultation performed).  Placenta previa again seen  There is normal amniotic fluid and fetal movement.  Additional heart anatomy seen today.  Per provider note bleeding resolved.  We agree with continued inpatient management, she is s/p  betamethasone administration.  Magnesium for neuprotection if delivery  becomes imminent. ---------------------------------------------------------------------- Recommendations  Repeat growth in 4 weeks.  Consider daily NST's while hospitalized. ----------------------------------------------------------------------               Lin Landsman, MD Electronically Signed Final Report   11/28/2019 09:58 am ----------------------------------------------------------------------    MAU Course  Procedures  MDM -VB with known placenta previa -RH positive -active bleeding visualized on exam -EFM: reactive       -baseline: 135       -variability: moderate       -accels: present, 15x15       -decels: absent       -TOCO: no ctx -called and spoke with Dr. Henderson Cloud to recommend admission, Dr. Henderson Cloud requests NPO status, LR 144mL/hr and requests MAU provider to enter those along with  basic admission orders. Reviewed tracing, no ctx, findings on physical exam at this time, s/p BMZ.. Dr. Henderson Cloud agrees with plan for admission. Advised patient may be held in MAU pending bed placement on [facility]. -admit to [facility].       Orders Placed This Encounter  Procedures  . Diet NPO time specified    Standing Status:   Standing    Number of Occurrences:   1  . Notify physician (specify)    Standing Status:   Standing    Number of Occurrences:   20    Order Specific Question:   Notify Physician    Answer:   for pulse less than 60 or greater than 120    Order Specific Question:   Notify Physician    Answer:   for respiratory rate less than 12 or greater than 28    Order Specific Question:   Notify Physician    Answer:   for temperature greater than 100.4    Order Specific Question:   Notify Physician    Answer:   for urinary output less than 30 ml/hr    Order Specific Question:   Notify Physician    Answer:   for systolic BP less than 80 or greater than 140    Order Specific Question:   Notify Physician    Answer:   for diastolic BP less than 40 or greater than 90  . Vital signs    While awake, respect sleep.    Standing Status:   Standing    Number of Occurrences:   1  . Defer vaginal exam for vaginal bleeding or PROM <37 weeks    Standing Status:   Standing    Number of Occurrences:   1  . Initiate Oral Care Protocol    Standing Status:   Standing    Number of Occurrences:   1  . Initiate Carrier Fluid Protocol    Standing Status:   Standing    Number of Occurrences:   1  . SCDs    Standing Status:   Standing    Number of Occurrences:   1    Order Specific Question:   Laterality    Answer:   Bilateral  . Fetal monitoring    Standing Status:   Standing    Number of Occurrences:   1  . Continuous tocometry    Standing Status:   Standing    Number of Occurrences:   1  . Full code     Standing Status:   Standing    Number of Occurrences:   1  . Type and screen [facility]    [facility]  Standing Status:   Standing    Number of Occurrences:   1  . Place in observation (patient's expected length of stay will be less than 2 midnights)    Standing Status:   Standing    Number of Occurrences:   1    Order Specific Question:   Hospital Area    Answer:   [facility] [[id number]]    Order Specific Question:   Level of Care    Answer:   Antepartum [20]    Order Specific Question:   Covid Evaluation    Answer:   Asymptomatic Screening Protocol (No Symptoms)    Order Specific Question:   Diagnosis    Answer:   Placenta previa [161096][[id number]]    Order Specific Question:   Admitting Physician    Answer:   Harold HedgeMBLIN, Euel Castile [phone][[id number]]    Order Specific Question:   Attending Physician    Answer:   Harold HedgeMBLIN, Alden Bensinger [phone][[id number]]       Meds ordered this encounter  Medications  . acetaminophen (TYLENOL) tablet 650 mg  . zolpidem (AMBIEN) tablet 5 mg  . docusate sodium (COLACE) capsule 100 mg  . calcium carbonate (TUMS - dosed in mg elemental calcium) chewable tablet 400 mg of elemental calcium  . prenatal multivitamin tablet 1 tablet  . lactated ringers infusion    Assessment and Plan   1. Placenta previa in third trimester   2. Bleeding in early pregnancy   3. Blood type, Rh positive    -[facility]  Kirsten Seraicole E Hunt 12/22/2019, 4:55 AM   Agree with above 34 yo G3P2 @ 29 1/7 weeks IVF pregnancy with known anterior placenta previa felt bleeding as above. She was admitted twice for observation after bleeding episodes and reports another episode of very brief bleeding 12/11/19. She was evaluated in office. U/S noted persistent anterior placenta previa, vtx, EFW 2# 6 oz (71%). She strongly desired to not go to hospital and was observed as outpatient. Today the blood ran down her  legs when she stood. She denies pain or cramping. [facility]. There were small spots on her pad.  Today's Vitals   12/22/19 0420 12/22/19 0424  BP: 109/75   Pulse: (!) 104   Resp: 18   Temp: 98.3 F (36.8 C)   TempSrc: Oral   SpO2: 100%   Weight:  79.6 kg  Height:  5\' 6"  (1.676 m)   Body mass index is 28.33 kg/m.  Uterus soft, NT  NST + accelerations UCs none tracing  A/P: 29 1/7 wks         Placenta previa with recurrent bleeding-now stable         S/P BMZ @ 25 wks. Per MFM recommendation will give rescue dose of BMZ in view of her increased bleeding this episode.         Magnesium sulfate for neuroprotection if delivery is required before 32 weeks         D/W with patient possible emergent cesarean section, possible vertical uterine incision and need for repeat C/S with future deliveries, possible hemorrhage requiring transfusion of blood products with risk of HIV/Hep, possible hysterectomy.        IV fluids, NPO for now, continuous FM, T&S, CBC        She declines PAS for now.

## 2019-12-22 NOTE — MAU Note (Signed)
Pt here with vaginal bleeding that started at 0345, has placenta previa. No pain, +FM

## 2019-12-22 NOTE — MAU Provider Note (Addendum)
History     CSN: [id number]  Arrival date and time: 12/22/19 0413   Event Date/Time   First Provider Initiated Contact with Patient 12/22/19 [phone]      Chief Complaint  Patient presents with  . Vaginal Bleeding   Ms. Lisl Slingerland is a 34 y.o. G3P2002 at [redacted]w[redacted]d who presents to MAU for known previa with bleeding starting about 1 hour ago. Patient reports she was sleeping and felt a "bubble" and then had bright red blood running down her legs. Patient has been previously hospitalized for bleeding with previa and had a recent US that showed no change in the position of the placenta. Patient denies contractions and reports good fetal movement.  Prenatal care provider? [facility]   OB History    Gravida  3   Para  2   Term  2   Preterm      AB      Living  2     SAB      IAB      Ectopic      Multiple  0   Live Births  2           Past Medical History:  Diagnosis Date  . Complication of anesthesia   . History of vertigo   . Hx of varicella   . PONV (postoperative nausea and vomiting)     Past Surgical History:  Procedure Laterality Date  . WISDOM TOOTH EXTRACTION      Family History  Problem Relation Age of Onset  . Thyroid disease Mother   . Migraines Mother   . Thyroid disease Maternal Aunt   . Migraines Maternal Aunt   . Cancer Paternal Aunt   . Hypertension Paternal Grandmother   . Marfan syndrome Maternal Grandfather     Social History   Tobacco Use  . Smoking status: Never Smoker  . Smokeless tobacco: Never Used  Vaping Use  . Vaping Use: Never used  Substance Use Topics  . Alcohol use: Not Currently    Comment: Occasional wine  . Drug use: No    Allergies:  Allergies  Allergen Reactions  . Lactose Intolerance (Gi) Other (See Comments)    Gas, stomach cramps    Medications Prior to Admission  Medication Sig Dispense Refill Last Dose  . cholecalciferol (VITAMIN D3) 25 MCG (1000 UNIT) tablet Take 1,000  Units by mouth daily.   12/21/2019 at Unknown time  . docusate sodium (COLACE) 100 MG capsule Take 100 mg by mouth every other day.   12/21/2019 at Unknown time  . lactase (LACTAID) 3000 units tablet Take 3,000 Units by mouth as needed (to prevent stomach cramps prior to a meal).    Past Week at Unknown time  . levothyroxine (SYNTHROID) 25 MCG tablet Take 25 mcg by mouth daily before breakfast.   12/22/2019 at Unknown time  . Prenatal Vit-Fe Fumarate-FA (PRENATAL MULTIVITAMIN) TABS tablet Take 1 tablet by mouth daily at 12 noon.   12/21/2019 at Unknown time  . calcium carbonate (TUMS - DOSED IN MG ELEMENTAL CALCIUM) 500 MG chewable tablet Chew 1-2 tablets by mouth daily as needed for indigestion or heartburn.       Review of Systems  Constitutional: Negative for chills, diaphoresis, fatigue and fever.  Eyes: Negative for visual disturbance.  Respiratory: Negative for shortness of breath.   Cardiovascular: Negative for chest pain.  Gastrointestinal: Negative for abdominal pain, constipation, diarrhea, nausea and vomiting.  Genitourinary: Positive for vaginal bleeding. Negative  for dysuria, flank pain, frequency, pelvic pain, urgency and vaginal discharge.  Neurological: Negative for dizziness, weakness, light-headedness and headaches.   Physical Exam   Blood pressure 109/75, pulse (!) 104, temperature 98.3 F (36.8 C), temperature source Oral, resp. rate 18, height  (1.676 m), weight 79.6 kg, SpO2 100 %, not currently breastfeeding.  Patient Vitals for the past 24 hrs:  BP Temp Temp src Pulse Resp SpO2 Height Weight  12/22/19 0424 -- -- -- -- -- --  (1.676 m) 79.6 kg  12/22/19 0420 109/75 98.3 F (36.8 C) Oral (!) 104 18 100 % -- --   Physical Exam Vitals and nursing note reviewed. Exam conducted with a chaperone present.  Constitutional:      General: She is not in acute distress.    Appearance: Normal appearance. She is not ill-appearing, toxic-appearing or diaphoretic.   HENT:     Head: Normocephalic and atraumatic.  Pulmonary:     Effort: Pulmonary effort is normal.  Genitourinary:    General: Normal vulva.     Labia:        Right: No rash, tenderness or lesion.        Left: No rash, tenderness or lesion.      Vagina: Bleeding present. No vaginal discharge.     Cervix: Cervical bleeding present. No friability.     Comments: On exam, small amount of bright red blood visualized on introduction of the speculum, able to be clear with single Fox swab, followed by ~3cm clot. No gush of bleeding behind the clot, but did have small amount of bright red bleeding coming from external os. Skin:    General: Skin is warm and dry.  Neurological:     Mental Status: She is alert and oriented to person, place, and time.  Psychiatric:        Mood and Affect: Mood normal.        Behavior: Behavior normal.        Thought Content: Thought content normal.        Judgment: Judgment normal.    No results found for this or any previous visit (from the past 24 hour(s)).  [place] MFM OB COMP + 14 WK  Result Date: 11/23/2019 ----------------------------------------------------------------------  OBSTETRICS REPORT                       (Signed Final 11/23/2019 04:15 pm) ---------------------------------------------------------------------- Patient Info  ID #:       [id number]                          D.O.B.:  May 05, 1985 (34 yrs)  Name:       LATANIA BASCOMB                    Visit Date: 11/23/2019 10:42 am ---------------------------------------------------------------------- Performed By  Attending:        Ma Rings MD         Referred By:      [facility]                                                             Care  Performed By:     Sandi Mealy  [facility] ---------------------------------------------------------------------- Orders  #  Description                            Code        Ordered By  1  [place] MFM OB COMP + 14 WK                [id number]    YU FANG ----------------------------------------------------------------------  #  Order #                     Accession #                Episode #  1  [id number]                   [id number]                 [id number] ---------------------------------------------------------------------- Indications  Placenta previa specified as without           O44.02  hemorrhage, second trimester  Vaginal bleeding in pregnancy, second          O46.92  trimester  [redacted] weeks gestation of pregnancy                Z3A.25  Encounter for antenatal screening for          Z36.3  malformations ---------------------------------------------------------------------- Fetal Evaluation  Num Of Fetuses:         1  Fetal Heart Rate(bpm):  149  Cardiac Activity:       Observed  Presentation:           Cephalic  Placenta:               Anterior previa  P. Cord Insertion:      Visualized  Amniotic Fluid  AFI FV:      Within normal limits                              Largest Pocket(cm)                              5.79 ---------------------------------------------------------------------- Biometry  BPD:        64  mm     G. Age:  25w 6d         73  %    CI:         73.6   %    70 - 86                                                          FL/HC:      19.2   %    18.7 - 20.3  HC:       237   mm  G. Age:  25w 5d         56  %    HC/AC:      1.14        1.04 - 1.22  AC:      207.9  mm     G. Age:  25w 2d         52  %    FL/BPD:     71.3   %    71 - 87  FL:       45.6  mm     G. Age:  25w 1d         39  %    FL/AC:      21.9   %    20 - 24  HUM:      42.6  mm     G. Age:  25w 4d         57  %  CER:      30.5  mm     G. Age:  26w 4d         88  %  LV:        5.3  mm  CM:        6.4  mm  Est. FW:     797  gm    1 lb 12 oz      55  % ---------------------------------------------------------------------- Gestational Age  Clinical EDD:  25w 0d                                         EDD:   03/07/20  U/S Today:     25w 4d                                        EDD:   03/03/20  Best:          25w 0d     Det. By:  Clinical EDD             EDD:   03/07/20 ---------------------------------------------------------------------- Anatomy  Cranium:               Appears normal         Aortic Arch:            Appears normal  Cavum:                 Appears normal         Ductal Arch:            Not well visualized  Ventricles:            Appears normal         Diaphragm:              Appears normal  Choroid Plexus:        Appears normal         Stomach:                Appears normal, left  sided  Cerebellum:            Appears normal         Abdomen:                Appears normal  Posterior Fossa:       Appears normal         Abdominal Wall:         Appears nml (cord                                                                        insert, abd wall)  Nuchal Fold:           Not applicable (>20    Cord Vessels:           Appears normal ([redacted]                         wks GA)                                        vessel cord)  Face:                  Appears normal         Kidneys:                Appear normal                         (orbits and profile)  Lips:                  Appears normal         Bladder:                Appears normal  Thoracic:              Appears normal         Spine:                  Limited views                                                                        appear normal  Heart:                 Appears normal         Upper Extremities:      Appears normal                         (4CH, axis, and                         situs)  RVOT:                  Appears normal  Lower Extremities:      Appears normal  LVOT:                  Not well visualized  Other:  Parents do not wish to know sex of fetus. Nasal bone visualized.          Technically difficult due to fetal position.  ---------------------------------------------------------------------- Cervix Uterus Adnexa  Cervix  Length:           3.01  cm. ---------------------------------------------------------------------- Comments  Yee Gangi is a 34 year old gravida 3 para 2 currently at 25  weeks and 0 days.  She was seen in consultation today due  to placenta previa with vaginal bleeding.  The patient was  admitted to the hospital last night after she went to the  bathroom and had some bright red blood in the toilet.  She  has a known placenta previa that was noted during her earlier  ultrasounds.  This is the first time that she has experienced  vaginal bleeding in her current pregnancy.  She denies  feeling any abdominal pain, cramping, or contractions.  This  is an IVF pregnancy.  The patient reports that she had prenatal genetic screening  as part of the IVF process that showed normal chromosomes.  She reports that she had a normal fetal anatomy scan  performed in your office.  Her blood type is A positive.  Her obstetrical history includes two prior uncomplicated full-  term vaginal deliveries.  Other than hypothyroidism, she denies any other significant  past medical or surgical history.  Due to vaginal bleeding with placenta previa, she was  admitted to the hospital and given a course of antenatal  corticosteroids.  Today, the patient reports that her vaginal  bleeding has resolved.  She reports feeling positive fetal  movements.  An ultrasound performed today shows that the fetus is in the  vertex presentation.  The overall EFW measured 1 pound 12  ounces (55th percentile for her gestational age).  There was  normal amniotic fluid noted.  The distal edge of the normal-  appearing anterior placenta appears to be covering the  internal cervical os.  The majority of the placenta appears to  be away from the cervix.  There were no sonographic signs of  placenta accreta noted.  The views of the fetal anatomy were limited today  due to the  fetal position.  The implications and management of placenta previa was  discussed with the patient and her husband today.  They  were advised that as only the distal edge of the anterior  placenta is covering the internal cervical os, I believe that  there probably is a greater than 90% chance that the  placenta previa will resolve later in her pregnancy.  She  should continue to be followed with serial ultrasound exams  every 4 to 5 weeks in your office to assess the fetal growth  and placental location.  These ultrasounds may be performed  in your office.  They understand that should the placenta previa persist  closer to the time of delivery, that a cesarean delivery will be  recommended at 37 weeks.  We will be happy to see her for  another ultrasound later in her pregnancy should there be  any further questions regarding the placental location.  As the patient is currently stable and no longer experiencing  any vaginal bleeding, consideration may be given for  discharge home on modified bedrest later this evening.  The  second dose of antenatal corticosteroids may be given early  so that she may be discharged at a reasonable hour.  Pelvic  rest was advised.  At the end of the consultation, the patient and her husband  stated that all their questions have been answered to their  complete satisfaction. ----------------------------------------------------------------------                   Ma Rings, MD Electronically Signed Final Report   11/23/2019 04:15 pm ----------------------------------------------------------------------  [place] MFM OB LIMITED  Result Date: 11/28/2019 ----------------------------------------------------------------------  OBSTETRICS REPORT                       (Signed Final 11/28/2019 09:58 am) ---------------------------------------------------------------------- Patient Info  ID #:       [id number]                          D.O.B.:  1985/03/07 (34 yrs)  Name:       LOUISIANA SEARLES              Visit Date: 11/28/2019 08:19 am ---------------------------------------------------------------------- Performed By  Attending:        Lin Landsman      Referred By:      [facility]                    MD                                       Care  Performed By:     Percell Boston          [facility] ---------------------------------------------------------------------- Orders  #  Description                           Code        Ordered By  1  [place] MFM OB LIMITED                     [id number]    DAVID LOWE ----------------------------------------------------------------------  #  Order #                     Accession #                Episode #  1  [id number]                   [id number]                 [id number] ---------------------------------------------------------------------- Indications  Vaginal bleeding in pregnancy, second          O46.92  trimester  [redacted] weeks gestation of pregnancy                Z3A.25  Placenta previa specified as without           O44.02  hemorrhage, second trimester  Encounter for other specified  antenatal        Z36.89  screening ---------------------------------------------------------------------- Fetal Evaluation  Num Of Fetuses:         1  Fetal Heart Rate(bpm):  147  Cardiac Activity:       Observed  Presentation:           Cephalic  Placenta:               Anterior previa  P. Cord Insertion:      Previously Visualized  Amniotic Fluid  AFI FV:      Within normal limits                              Largest Pocket(cm)                              6 ---------------------------------------------------------------------- Gestational Age  Clinical EDD:  25w 5d                                        EDD:   03/07/20  Best:          25w 5d     Det. By:  Clinical EDD             EDD:   03/07/20  ---------------------------------------------------------------------- Anatomy  Thoracic:              Appears normal         LVOT:                   Appears normal  Heart:                 Appears normal         Stomach:                Appears normal, left                         (4CH, axis, and                                sided                         situs)  RVOT:                  Appears normal         Bladder:                Appears normal ---------------------------------------------------------------------- Cervix Uterus Adnexa  Cervix  Length:            2.5  cm.  Closed  Uterus  No abnormality visualized.  Right Ovary  No adnexal mass visualized.  Left Ovary  No adnexal mass visualized.  Cul De Sac  No free fluid seen.  Adnexa  No abnormality visualized. ---------------------------------------------------------------------- Impression  Limited exam due to vaginal bleeding for known placenta  previa (previous consultation performed).  Placenta previa again seen  There is normal amniotic fluid and fetal movement.  Additional heart anatomy seen today.  Per provider note bleeding resolved.  We agree with continued inpatient management, she is s/p  betamethasone administration.  Magnesium for neuprotection if delivery  becomes imminent. ---------------------------------------------------------------------- Recommendations  Repeat growth in 4 weeks.  Consider daily NST's while hospitalized. ----------------------------------------------------------------------               Lin Landsman, MD Electronically Signed Final Report   11/28/2019 09:58 am ----------------------------------------------------------------------   MAU Course  Procedures  MDM -VB with known placenta previa -RH positive -active bleeding visualized on exam -EFM: reactive       -baseline: 135       -variability: moderate       -accels: present, 15x15       -decels: absent       -TOCO: no ctx -called and spoke with Dr.  Henderson Cloud to recommend admission, Dr. Henderson Cloud requests NPO status, LR 133mL/hr and requests MAU provider to enter those along with basic admission orders. Reviewed tracing, no ctx, findings on physical exam at this time, s/p BMZ.. Dr. Henderson Cloud agrees with plan for admission. Advised patient may be held in MAU pending bed placement on [facility]. -admit to [facility].  Orders Placed This Encounter  Procedures  . Diet NPO time specified    Standing Status:   Standing    Number of Occurrences:   1  . Notify physician (specify)    Standing Status:   Standing    Number of Occurrences:   20    Order Specific Question:   Notify Physician    Answer:   for pulse less than 60 or greater than 120    Order Specific Question:   Notify Physician    Answer:   for respiratory rate less than 12 or greater than 28    Order Specific Question:   Notify Physician    Answer:   for temperature greater than 100.4    Order Specific Question:   Notify Physician    Answer:   for urinary output less than 30 ml/hr    Order Specific Question:   Notify Physician    Answer:   for systolic BP less than 80 or greater than 140    Order Specific Question:   Notify Physician    Answer:   for diastolic BP less than 40 or greater than 90  . Vital signs    While awake, respect sleep.    Standing Status:   Standing    Number of Occurrences:   1  . Defer vaginal exam for vaginal bleeding or PROM <37 weeks    Standing Status:   Standing    Number of Occurrences:   1  . Initiate Oral Care Protocol    Standing Status:   Standing    Number of Occurrences:   1  . Initiate Carrier Fluid Protocol    Standing Status:   Standing    Number of Occurrences:   1  . SCDs    Standing Status:   Standing    Number of Occurrences:   1    Order Specific Question:   Laterality    Answer:   Bilateral  . Fetal monitoring    Standing Status:   Standing    Number of Occurrences:   1  . Continuous tocometry    Standing Status:    Standing    Number of Occurrences:   1  . Full code    Standing Status:   Standing    Number of Occurrences:   1  . Type and screen [facility]    [facility]     Standing Status:  Standing    Number of Occurrences:   1  . Place in observation (patient's expected length of stay will be less than 2 midnights)    Standing Status:   Standing    Number of Occurrences:   1    Order Specific Question:   Hospital Area    Answer:   [facility] [[id number]]    Order Specific Question:   Level of Care    Answer:   Antepartum [20]    Order Specific Question:   Covid Evaluation    Answer:   Asymptomatic Screening Protocol (No Symptoms)    Order Specific Question:   Diagnosis    Answer:   Placenta previa [409811]    Order Specific Question:   Admitting Physician    Answer:   Harold Hedge [phone]    Order Specific Question:   Attending Physician    Answer:   Harold Hedge [phone]    Meds ordered this encounter  Medications  . acetaminophen (TYLENOL) tablet 650 mg  . zolpidem (AMBIEN) tablet 5 mg  . docusate sodium (COLACE) capsule 100 mg  . calcium carbonate (TUMS - dosed in mg elemental calcium) chewable tablet 400 mg of elemental calcium  . prenatal multivitamin tablet 1 tablet  . lactated ringers infusion    Assessment and Plan   1. Placenta previa in third trimester   2. Bleeding in early pregnancy   3. Blood type, Rh positive    -admit to [facility]  Odie Sera Sandeep Delagarza 12/22/2019, 4:55 AM

## 2019-12-23 NOTE — Progress Notes (Signed)
29 2/7  No bleeding, feels good  Today's Vitals   12/23/19 0612 12/23/19 0615 12/23/19 0801 12/23/19 0803  BP: (!) 101/54  (!) 96/55   Pulse: 86  81   Resp:   18   Temp:   98.4 F (36.9 C)   TempSrc:   Oral   SpO2:   98%   Weight:      Height:      PainSc:  0-No pain  0-No pain   Body mass index is 28.33 kg/m.   Uterus soft and NT  NST reactive  A/P: Anterior Placenta Previa with recurrent bleeding now stable         BMZ rescue dose ordered         FWB NST reactive         IV to saline lock         NST q shift         D/W continued observation         U/S tomorrow

## 2019-12-24 ENCOUNTER — Observation Stay (HOSPITAL_BASED_OUTPATIENT_CLINIC_OR_DEPARTMENT_OTHER): Payer: BC Managed Care – PPO

## 2019-12-24 DIAGNOSIS — Z3689 Encounter for other specified antenatal screening: Secondary | ICD-10-CM | POA: Diagnosis not present

## 2019-12-24 DIAGNOSIS — Z3A29 29 weeks gestation of pregnancy: Secondary | ICD-10-CM | POA: Diagnosis not present

## 2019-12-24 DIAGNOSIS — O4413 Placenta previa with hemorrhage, third trimester: Secondary | ICD-10-CM

## 2019-12-24 NOTE — Progress Notes (Signed)
Antepartum Progress Note  Kirsten Hunt is a 34 y.o. [id number] at 29+3 admitted for vaginal bleeding in the setting of anterior placenta previa.  She reports no overnight events. Has not had bleeding since initial presentation.  Reports good fetal movement, denies cramping, contractions, LOF.   Today's Vitals   12/23/19 1939 12/23/19 1940 12/23/19 2131 12/24/19 0645  BP: 111/70  100/62 (!) 97/56  Pulse: 89  87 78  Resp: 16  16 15   Temp: 98.2 F (36.8 C)  97.9 F (36.6 C) 98.4 F (36.9 C)  TempSrc: Oral  Oral Oral  SpO2: 100%  100% 100%  Weight:      Height:      PainSc:  0-No pain     Body mass index is 28.33 kg/m.   Gen: well appearing, no distress Chest: nonlabored breaths Abdomen: soft, gravid, nontender Ext: no signs of DVT  NST reactive  A/P:  Anterior Placenta Previa with recurrent bleeding now stable  BMZ rescue course 12/18 - 12/19  FWB NST reactive  IV to saline lock  NST q shift  Repeat MFM 08-11-1995 today

## 2019-12-25 NOTE — Progress Notes (Signed)
INitial visit with Ha to introduce spiritual care and offer support as well as education regarding Advance Directives. [name] has two children at home and has been in and out of the hospital several times recently for bleeding. She misses her children and feels guilty seeing her husband stretched so thin despite his encouragement for her to rest as much as possible.  She's hopeful she'll be able to go home before Christmas but may miss her husband and daughter's birthdays which are both this week. Chaplain offered listening presence and life review.  Chaplain provided education about Advance Directives including living will and health care power of attorney. At this time, Kirsten Hunt is comfortable knowing her husband will make medical decisions if she is unable, but would still like to complete documentation. She will talk with her husband this evening to consider the rest of the relevant decisions.  Will follow up.  Please page as further needs arise.  [name]. Carley Hammed, M.Div. [facility] Chaplain Pager [phone] Office [phone]

## 2019-12-25 NOTE — Progress Notes (Signed)
S: doing well. Denies ctxn, LOF and recurrent bleeding. Has not had any spotting or bleeding since admission. She strongly desires to go home Thursday which is HD#6. She is missing both her husband and daughter's birthday being here this week.   O:  Vitals:   12/24/19 2143 12/25/19 0647  BP: (!) 95/56 (!) 92/55  Pulse: 70 76  Resp: 17 16  Temp: 98.1 F (36.7 C) 97.8 F (36.6 C)  SpO2: 100% 99%   Gen: well appearing, NAD Pulm: NWOB Abd: Soft, gravid SVE deferred  A/P: 34 yo G3P2002 @ 29.4 wga w/known placenta previa.  - NST q shift - BMZ complete s/p rescue course 12/18-19 - Limited MFM [place] yesterday reassuring - Continue serial growth - last in office 12/11/19 reassuring 71%ile - As long as stable by HD#6, may consider early d/c    Rosie Fate MD

## 2019-12-26 LAB — CBC
HCT: 31.5 % — ABNORMAL LOW (ref 36.0–46.0)
Hemoglobin: 10.4 g/dL — ABNORMAL LOW (ref 12.0–15.0)
MCH: 32.1 pg (ref 26.0–34.0)
MCHC: 33 g/dL (ref 30.0–36.0)
MCV: 97.2 fL (ref 80.0–100.0)
Platelets: 187 10*3/uL (ref 150–400)
RBC: 3.24 MIL/uL — ABNORMAL LOW (ref 3.87–5.11)
RDW: 12.5 % (ref 11.5–15.5)
WBC: 10.7 10*3/uL — ABNORMAL HIGH (ref 4.0–10.5)
nRBC: 0 % (ref 0.0–0.2)

## 2019-12-26 LAB — TYPE AND SCREEN
ABO/RH(D): A POS
Antibody Screen: NEGATIVE

## 2019-12-26 MED ORDER — FERROUS SULFATE 325 (65 FE) MG PO TABS
325.0000 mg | ORAL_TABLET | Freq: Two times a day (BID) | ORAL | Status: DC
  Administered 2019-12-26 – 2019-12-27 (×3): 325 mg via ORAL
  Filled 2019-12-26 (×3): qty 1

## 2019-12-26 NOTE — Progress Notes (Addendum)
No current c/o. No VB since day of admission (12/18).  Active FM.  No CTX.  VSS. AF. NST Cat I  CBC    Component Value Date/Time   WBC 10.7 (H) 12/26/2019 0551   RBC 3.24 (L) 12/26/2019 0551   HGB 10.4 (L) 12/26/2019 0551   HCT 31.5 (L) 12/26/2019 0551   PLT 187 12/26/2019 0551   MCV 97.2 12/26/2019 0551   MCH 32.1 12/26/2019 0551   MCHC 33.0 12/26/2019 0551   RDW 12.5 12/26/2019 0551   Gen: A&O x 3 Abd: soft, NT Ext: no c/c/e  A/P: 34 yo G3P2002 at [redacted]w[redacted]d with placenta previa; HD5 admitted for third bleed.  - NST q shift - BMZ complete s/p rescue course 12/18-19 - Continue serial growth - last in office 12/11/19 reassuring 71%ile - Chronic anemia-FeSO4 - Patient has been extensively counseled re: risk of bleeding with previa including fetal and maternal hemorrhage and associated morbidity and mortality. She understands that bleeding can occur quickly and heavily which would prompt need for emergent C/S delivery.  She understands that this would be delayed if she has to travel at home which may worsen outcomes for both herself and her baby.  Patient has two children at home and strongly desires discharge home tomorrow.  She has tremendous support from both sets of grandparents and her husband.  She is able to maintain bedrest while at home.  She lives 8 minutes from the hospital.  Will consider d/c home tomorrow if continued stability.    [name], DO

## 2019-12-27 NOTE — Progress Notes (Signed)
Discharge instructions given to pt. Discussed signs and symptoms to report to the MD, upcoming appointments, and meds. Pt verbalizes understanding and has no questions or concerns at this time. Pt discharged home from hospital in stable condition. 

## 2019-12-27 NOTE — Discharge Summary (Signed)
Physician Discharge Summary  Patient ID: Kirsten Hunt MRN: [id number] DOB/AGE: 03-10-1985 34 y.o.  Admit date: 12/22/2019 Discharge date: 12/27/2019  Admission Diagnoses:placenta previa  Discharge Diagnoses:  Active Problems:   Placenta previa   Discharged Condition: stable  Hospital Course: admitted for third admission with recurrent bleeding and placenta previa. She stabilized rapidly with IV fluids and had no further bleeding. She received a rescue dose of betamethasone 12/18-12/10. MFM U/S noted EFW 44%, vtx and anterior placenta previa. She is anxious to go home and lives less than 10 minutes from hospital.  Consults: None  Significant Diagnostic Studies: labs:  Results for orders placed or performed during the hospital encounter of 12/22/19 (from the past 72 hour(s))  Type and screen [facility]     Status: None   Collection Time: 12/26/19  5:51 AM  Result Value Ref Range   ABO/RH(D) A POS    Antibody Screen NEG    Sample Expiration      12/29/2019,2359 Performed at [facility], [address], [place], [place] [address]   CBC     Status: Abnormal   Collection Time: 12/26/19  5:51 AM  Result Value Ref Range   WBC 10.7 (H) 4.0 - 10.5 K/uL   RBC 3.24 (L) 3.87 - 5.11 MIL/uL   Hemoglobin 10.4 (L) 12.0 - 15.0 g/dL   HCT 20.2 (L) 54.2 - 70.6 %   MCV 97.2 80.0 - 100.0 fL   MCH 32.1 26.0 - 34.0 pg   MCHC 33.0 30.0 - 36.0 g/dL   RDW 23.7 62.8 - 31.5 %   Platelets 187 150 - 400 K/uL   nRBC 0.0 0.0 - 0.2 %    Comment: Performed at [facility], [address] [place], [place] [address]    Treatments: IV hydration and betamethasone  Discharge Exam: Blood pressure (!) 90/59, pulse 88, temperature 98.3 F (36.8 C), temperature source Oral, resp. rate 16, height 5\' 6"  (1.676 m), weight 79.6 kg, SpO2 99 %, not currently breastfeeding. General appearance: alert, cooperative and no distress  Disposition: Discharge disposition: 01-Home  or Self Care        Allergies as of 12/27/2019      Reactions   Lactose Intolerance (gi) Other (See Comments)   Gas, stomach cramps      Medication List    TAKE these medications   calcium carbonate 500 MG chewable tablet Commonly known as: TUMS - dosed in mg elemental calcium Chew 1-2 tablets by mouth daily as needed for indigestion or heartburn.   cholecalciferol 25 MCG (1000 UNIT) tablet Commonly known as: VITAMIN D3 Take 1,000 Units by mouth daily.   docusate sodium 100 MG capsule Commonly known as: COLACE Take 100 mg by mouth every other day.   lactase 3000 units tablet Commonly known as: LACTAID Take 3,000 Units by mouth daily as needed (to prevent stomach cramps prior to a meal).   levothyroxine 25 MCG tablet Commonly known as: SYNTHROID Take 25 mcg by mouth daily before breakfast.   prenatal multivitamin Tabs tablet Take 1 tablet by mouth daily at 12 noon.        Signed: 12/29/2019 II 12/27/2019, 8:19 AM

## 2019-12-27 NOTE — Progress Notes (Signed)
29 6/7 wks  No bleeding, no UCs  Today's Vitals   12/27/19 0355 12/27/19 0500 12/27/19 0601 12/27/19 0659  BP:   (!) 90/59   Pulse:   88   Resp:   16   Temp:   98.3 F (36.8 C)   TempSrc:   Oral   SpO2:   99%   Weight:      Height:      PainSc: Asleep Asleep 0-No pain Asleep   Body mass index is 28.33 kg/m.   NST reactive  A/P: Placenta Previa - third admission for bleed, now stable with no further bleeding since admission         BMZ rescue course  12/18-12/19         Anemia > iron supplementation         Patient very much wants to go home. She lives less than 10 minutes from hospital. I reiterated the possibility of unpredictable, catastrophic life threatening bleeding at home. She states she understands.         Discharge home, PR, very limited activity-BR, no driving, no lifting. Advised she should not be alone. Someone capable of calling 911 should always be with her. She states she understands and has good family support.

## 2019-12-27 NOTE — Plan of Care (Signed)
  Problem: Education: Goal: Knowledge of disease or condition will improve Outcome: Completed/Met Goal: Knowledge of the prescribed therapeutic regimen will improve Outcome: Completed/Met   Problem: Education: Goal: Knowledge of General Education information will improve Description: Including pain rating scale, medication(s)/side effects and non-pharmacologic comfort measures Outcome: Completed/Met   Problem: Activity: Goal: Risk for activity intolerance will decrease Outcome: Completed/Met   Problem: Nutrition: Goal: Adequate nutrition will be maintained Outcome: Completed/Met   Problem: Coping: Goal: Level of anxiety will decrease Outcome: Completed/Met   Problem: Elimination: Goal: Will not experience complications related to urinary retention Outcome: Completed/Met   Problem: Pain Managment: Goal: General experience of comfort will improve Outcome: Completed/Met   Problem: Safety: Goal: Ability to remain free from injury will improve Outcome: Completed/Met

## 2020-01-08 DIAGNOSIS — O4403 Placenta previa specified as without hemorrhage, third trimester: Secondary | ICD-10-CM | POA: Diagnosis not present

## 2020-01-08 DIAGNOSIS — Z3A31 31 weeks gestation of pregnancy: Secondary | ICD-10-CM | POA: Diagnosis not present

## 2020-01-25 ENCOUNTER — Telehealth (HOSPITAL_COMMUNITY): Payer: Self-pay | Admitting: *Deleted

## 2020-01-25 NOTE — Patient Instructions (Addendum)
Lindzee Gouge  01/25/2020   Your procedure is scheduled on:  02/08/2020  Arrive at 0530 at Entrance C on [address] at [facility]  and [employer]. You are invited to use the FREE valet parking or use the Visitor's parking deck.  Pick up the phone at the desk and dial [phone].  Call this number if you have problems the morning of surgery: [phone]  Remember:   Do not eat food:(After Midnight) Desps de medianoche.  Do not drink clear liquids: (After Midnight) Desps de medianoche.  Take these medicines the morning of surgery with A SIP OF WATER:  Take levothyroxine as prescribed   Do not wear jewelry, make-up or nail polish.  Do not wear lotions, powders, or perfumes. Do not wear deodorant.  Do not shave 48 hours prior to surgery.  Do not bring valuables to the hospital.  [facility] is not   responsible for any belongings or valuables brought to the hospital.  Contacts, dentures or bridgework may not be worn into surgery.  Leave suitcase in the car. After surgery it may be brought to your room.  For patients admitted to the hospital, checkout time is 11:00 AM the day of              discharge.      Please read over the following fact sheets that you were given:     Preparing for Surgery

## 2020-01-25 NOTE — Telephone Encounter (Signed)
Preadmission screen  

## 2020-01-31 NOTE — H&P (Signed)
Kirsten Hunt is a 35 y.o. female presenting for primary C/S for placenta previa.  Patient received BMZ 2/2 and 2/3.  Pregnancy is product of IVF and PGT was done on embryo.  Patient declined fetal ECHO.  Antepartum course otherwise uncomplicated.  OB History    Gravida  3   Para  2   Term  2   Preterm      AB      Living  2     SAB      IAB      Ectopic      Multiple  0   Live Births  2          Past Medical History:  Diagnosis Date  . Complication of anesthesia   . History of vertigo   . Hx of varicella   . PONV (postoperative nausea and vomiting)    Past Surgical History:  Procedure Laterality Date  . WISDOM TOOTH EXTRACTION     Family History: family history includes Cancer in her paternal aunt; Hypertension in her paternal grandmother; Marfan syndrome in her maternal grandfather; Migraines in her maternal aunt and mother; Thyroid disease in her maternal aunt and mother. Social History:  reports that she has never smoked. She has never used smokeless tobacco. She reports previous alcohol use. She reports that she does not use drugs.     Maternal Diabetes: No Genetic Screening: Normal Maternal Ultrasounds/Referrals: Normal Fetal Ultrasounds or other Referrals:  None Maternal Substance Abuse:  No Significant Maternal Medications:  None Significant Maternal Lab Results:  None Other Comments:  None  Review of Systems Maternal Medical History:  Prenatal complications: Placental abnormality.   Prenatal Complications - Diabetes: none.      There were no vitals taken for this visit. Maternal Exam:  Abdomen: Patient reports no abdominal tenderness. Fundal height is c/w dates.   Estimated fetal weight is 7#.       Physical Exam Constitutional:      Appearance: Normal appearance.  HENT:     Head: Normocephalic and atraumatic.  Pulmonary:     Effort: Pulmonary effort is normal.  Abdominal:     Palpations: Abdomen is soft.   Musculoskeletal:        General: Normal range of motion.     Cervical back: Normal range of motion and neck supple.  Skin:    General: Skin is warm and dry.  Neurological:     Mental Status: She is alert and oriented to person, place, and time.  Psychiatric:        Mood and Affect: Mood normal.        Behavior: Behavior normal.     Prenatal labs: ABO, Rh: --/--/A POS (12/22 0551) Antibody: NEG (12/22 0551) Rubella: Immune (07/27 0000) RPR: Nonreactive (07/27 0000)  HBsAg: Negative (07/27 0000)  HIV: Non Reactive (11/18 2217)  GBS:     Assessment/Plan: 34yo G3P2002 at 36 weeks for primary C/S secondary to placenta previa Patient has been counseled re: risk of bleeding, infection, scarring, and damage to surrounding structures.  She is informed of implications in future pregnancies including uterine rupture and abnormal placentation.  All questions have been answered and patient wishes to proceed.   [name] 01/31/2020, 8:37 AM

## 2020-02-01 ENCOUNTER — Inpatient Hospital Stay (HOSPITAL_COMMUNITY)
Admission: AD | Admit: 2020-02-01 | Discharge: 2020-02-04 | DRG: 786 | Disposition: A | Discharge status: Home / Self Care | Payer: BC Managed Care – PPO | Attending: Obstetrics & Gynecology | Admitting: Obstetrics & Gynecology

## 2020-02-01 ENCOUNTER — Encounter (HOSPITAL_COMMUNITY): Payer: Self-pay | Admitting: Obstetrics and Gynecology

## 2020-02-01 ENCOUNTER — Other Ambulatory Visit: Payer: Self-pay

## 2020-02-01 ENCOUNTER — Encounter (HOSPITAL_COMMUNITY)
Admission: AD | Disposition: A | Discharge status: Home / Self Care | Payer: Self-pay | Source: Home / Self Care | Attending: Obstetrics & Gynecology

## 2020-02-01 ENCOUNTER — Inpatient Hospital Stay (HOSPITAL_COMMUNITY): Payer: BC Managed Care – PPO | Admitting: Anesthesiology

## 2020-02-01 DIAGNOSIS — U071 COVID-19: Secondary | ICD-10-CM | POA: Diagnosis present

## 2020-02-01 DIAGNOSIS — Z98891 History of uterine scar from previous surgery: Secondary | ICD-10-CM

## 2020-02-01 DIAGNOSIS — O4413 Placenta previa with hemorrhage, third trimester: Principal | ICD-10-CM | POA: Diagnosis present

## 2020-02-01 DIAGNOSIS — O4403 Placenta previa specified as without hemorrhage, third trimester: Secondary | ICD-10-CM

## 2020-02-01 DIAGNOSIS — O9852 Other viral diseases complicating childbirth: Secondary | ICD-10-CM | POA: Diagnosis present

## 2020-02-01 DIAGNOSIS — Z3A35 35 weeks gestation of pregnancy: Secondary | ICD-10-CM

## 2020-02-01 DIAGNOSIS — O4400 Placenta previa specified as without hemorrhage, unspecified trimester: Secondary | ICD-10-CM | POA: Diagnosis present

## 2020-02-01 LAB — CBC
HCT: 38.3 % (ref 36.0–46.0)
Hemoglobin: 12.5 g/dL (ref 12.0–15.0)
MCH: 31.6 pg (ref 26.0–34.0)
MCHC: 32.6 g/dL (ref 30.0–36.0)
MCV: 96.7 fL (ref 80.0–100.0)
Platelets: 200 10*3/uL (ref 150–400)
RBC: 3.96 MIL/uL (ref 3.87–5.11)
RDW: 12.5 % (ref 11.5–15.5)
WBC: 11.4 10*3/uL — ABNORMAL HIGH (ref 4.0–10.5)
nRBC: 0 % (ref 0.0–0.2)

## 2020-02-01 LAB — TYPE AND SCREEN
ABO/RH(D): A POS
Antibody Screen: NEGATIVE

## 2020-02-01 LAB — RPR: RPR Ser Ql: NONREACTIVE

## 2020-02-01 LAB — SARS CORONAVIRUS 2 BY RT PCR (HOSPITAL ORDER, PERFORMED IN ~~LOC~~ HOSPITAL LAB): SARS Coronavirus 2: POSITIVE — AB

## 2020-02-01 SURGERY — Surgical Case
Anesthesia: Spinal

## 2020-02-01 MED ORDER — SENNOSIDES-DOCUSATE SODIUM 8.6-50 MG PO TABS
2.0000 | ORAL_TABLET | Freq: Every day | ORAL | Status: DC
  Administered 2020-02-02: 2 via ORAL
  Filled 2020-02-01: qty 2

## 2020-02-01 MED ORDER — NALOXONE HCL 4 MG/10ML IJ SOLN
1.0000 ug/kg/h | INTRAMUSCULAR | Status: DC | PRN
Start: 2020-02-01 — End: 2020-02-04
  Filled 2020-02-01: qty 5

## 2020-02-01 MED ORDER — SCOPOLAMINE 1 MG/3DAYS TD PT72
1.0000 | MEDICATED_PATCH | Freq: Once | TRANSDERMAL | Status: AC
  Administered 2020-02-01: 1.5 mg via TRANSDERMAL

## 2020-02-01 MED ORDER — WITCH HAZEL-GLYCERIN EX PADS: 1.0000 "application " | MEDICATED_PAD | CUTANEOUS | Status: DC | PRN

## 2020-02-01 MED ORDER — LACTATED RINGERS IV SOLN: INTRAVENOUS | Status: DC | PRN

## 2020-02-01 MED ORDER — DIPHENHYDRAMINE HCL 25 MG PO CAPS: 25.0000 mg | ORAL_CAPSULE | ORAL | Status: DC | PRN

## 2020-02-01 MED ORDER — SIMETHICONE 80 MG PO CHEW
80.0000 mg | CHEWABLE_TABLET | Freq: Three times a day (TID) | ORAL | Status: DC
  Administered 2020-02-02 – 2020-02-04 (×7): 80 mg via ORAL
  Filled 2020-02-01 (×9): qty 1

## 2020-02-01 MED ORDER — PHENYLEPHRINE HCL-NACL 20-0.9 MG/250ML-% IV SOLN
INTRAVENOUS | Status: AC
  Filled 2020-02-01: qty 250

## 2020-02-01 MED ORDER — IBUPROFEN 800 MG PO TABS
800.0000 mg | ORAL_TABLET | Freq: Four times a day (QID) | ORAL | Status: DC
  Administered 2020-02-02 – 2020-02-04 (×9): 800 mg via ORAL
  Filled 2020-02-01 (×9): qty 1

## 2020-02-01 MED ORDER — MORPHINE SULFATE (PF) 0.5 MG/ML IJ SOLN
INTRAMUSCULAR | Status: AC
  Filled 2020-02-01: qty 10

## 2020-02-01 MED ORDER — ONDANSETRON HCL 4 MG/2ML IJ SOLN
INTRAMUSCULAR | Status: DC | PRN
  Administered 2020-02-01 (×3): 4 mg via INTRAVENOUS

## 2020-02-01 MED ORDER — POVIDONE-IODINE 10 % EX SWAB
2.0000 "application " | Freq: Once | CUTANEOUS | Status: AC
  Administered 2020-02-01: 2 via TOPICAL

## 2020-02-01 MED ORDER — DEXAMETHASONE SODIUM PHOSPHATE 10 MG/ML IJ SOLN
INTRAMUSCULAR | Status: DC | PRN
  Administered 2020-02-01: 8 mg via INTRAVENOUS

## 2020-02-01 MED ORDER — LEVOTHYROXINE SODIUM 50 MCG PO TABS
50.0000 ug | ORAL_TABLET | ORAL | Status: DC
  Administered 2020-02-02: 50 ug via ORAL
  Filled 2020-02-01: qty 1

## 2020-02-01 MED ORDER — FAMOTIDINE IN NACL 20-0.9 MG/50ML-% IV SOLN
INTRAVENOUS | Status: AC
  Administered 2020-02-01: 20 mg via INTRAVENOUS
  Filled 2020-02-01: qty 50

## 2020-02-01 MED ORDER — PRENATAL MULTIVITAMIN CH
1.0000 | ORAL_TABLET | Freq: Every day | ORAL | Status: DC
  Administered 2020-02-02 – 2020-02-04 (×3): 1 via ORAL
  Filled 2020-02-01 (×4): qty 1

## 2020-02-01 MED ORDER — OXYTOCIN-SODIUM CHLORIDE 30-0.9 UT/500ML-% IV SOLN
INTRAVENOUS | Status: AC
  Filled 2020-02-01: qty 500

## 2020-02-01 MED ORDER — LACTATED RINGERS IV SOLN: INTRAVENOUS | Status: DC

## 2020-02-01 MED ORDER — NALBUPHINE HCL 10 MG/ML IJ SOLN: 5.0000 mg | INTRAMUSCULAR | Status: DC | PRN

## 2020-02-01 MED ORDER — DIPHENHYDRAMINE HCL 25 MG PO CAPS: 25.0000 mg | ORAL_CAPSULE | Freq: Four times a day (QID) | ORAL | Status: DC | PRN

## 2020-02-01 MED ORDER — ACETAMINOPHEN 500 MG PO TABS: 1000.0000 mg | ORAL_TABLET | Freq: Four times a day (QID) | ORAL | Status: DC

## 2020-02-01 MED ORDER — KETOROLAC TROMETHAMINE 30 MG/ML IJ SOLN
30.0000 mg | Freq: Four times a day (QID) | INTRAMUSCULAR | Status: AC | PRN
  Administered 2020-02-01: 30 mg via INTRAVENOUS

## 2020-02-01 MED ORDER — MORPHINE SULFATE (PF) 0.5 MG/ML IJ SOLN
INTRAMUSCULAR | Status: DC | PRN
  Administered 2020-02-01: 150 ug via INTRATHECAL

## 2020-02-01 MED ORDER — FAMOTIDINE IN NACL 20-0.9 MG/50ML-% IV SOLN: 20.0000 mg | Freq: Once | INTRAVENOUS | Status: AC

## 2020-02-01 MED ORDER — KETOROLAC TROMETHAMINE 30 MG/ML IJ SOLN
INTRAMUSCULAR | Status: AC
  Filled 2020-02-01: qty 1

## 2020-02-01 MED ORDER — KETOROLAC TROMETHAMINE 30 MG/ML IJ SOLN
30.0000 mg | Freq: Four times a day (QID) | INTRAMUSCULAR | Status: AC
  Administered 2020-02-01 – 2020-02-02 (×4): 30 mg via INTRAVENOUS
  Filled 2020-02-01 (×4): qty 1

## 2020-02-01 MED ORDER — ONDANSETRON HCL 4 MG/2ML IJ SOLN
4.0000 mg | Freq: Three times a day (TID) | INTRAMUSCULAR | Status: DC | PRN
  Filled 2020-02-01: qty 2

## 2020-02-01 MED ORDER — BUPIVACAINE IN DEXTROSE 0.75-8.25 % IT SOLN
INTRATHECAL | Status: DC | PRN
  Administered 2020-02-01: 1.6 mL via INTRATHECAL

## 2020-02-01 MED ORDER — DEXAMETHASONE SODIUM PHOSPHATE 10 MG/ML IJ SOLN
INTRAMUSCULAR | Status: AC
  Filled 2020-02-01: qty 1

## 2020-02-01 MED ORDER — FENTANYL CITRATE (PF) 100 MCG/2ML IJ SOLN
INTRAMUSCULAR | Status: DC | PRN
  Administered 2020-02-01: 15 ug via INTRATHECAL

## 2020-02-01 MED ORDER — TETANUS-DIPHTH-ACELL PERTUSSIS 5-2.5-18.5 LF-MCG/0.5 IM SUSY: 0.5000 mL | PREFILLED_SYRINGE | Freq: Once | INTRAMUSCULAR | Status: DC

## 2020-02-01 MED ORDER — DIPHENHYDRAMINE HCL 50 MG/ML IJ SOLN: 12.5000 mg | INTRAMUSCULAR | Status: DC | PRN

## 2020-02-01 MED ORDER — SCOPOLAMINE 1 MG/3DAYS TD PT72
MEDICATED_PATCH | TRANSDERMAL | Status: AC
  Filled 2020-02-01: qty 1

## 2020-02-01 MED ORDER — FENTANYL CITRATE (PF) 100 MCG/2ML IJ SOLN
25.0000 ug | INTRAMUSCULAR | Status: DC | PRN
Start: 2020-02-01 — End: 2020-02-01

## 2020-02-01 MED ORDER — COCONUT OIL OIL: 1.0000 "application " | TOPICAL_OIL | Status: DC | PRN

## 2020-02-01 MED ORDER — OXYTOCIN-SODIUM CHLORIDE 30-0.9 UT/500ML-% IV SOLN: 2.5000 [IU]/h | INTRAVENOUS | Status: AC

## 2020-02-01 MED ORDER — SODIUM CHLORIDE 0.9 % IV SOLN: INTRAVENOUS | Status: DC | PRN

## 2020-02-01 MED ORDER — SOD CITRATE-CITRIC ACID 500-334 MG/5ML PO SOLN: 30.0000 mL | Freq: Once | ORAL | Status: AC

## 2020-02-01 MED ORDER — SIMETHICONE 80 MG PO CHEW
80.0000 mg | CHEWABLE_TABLET | ORAL | Status: DC | PRN
  Administered 2020-02-01 – 2020-02-02 (×2): 80 mg via ORAL
  Filled 2020-02-01: qty 1

## 2020-02-01 MED ORDER — ONDANSETRON HCL 4 MG/2ML IJ SOLN
INTRAMUSCULAR | Status: AC
  Filled 2020-02-01: qty 2

## 2020-02-01 MED ORDER — NALOXONE HCL 0.4 MG/ML IJ SOLN: 0.4000 mg | INTRAMUSCULAR | Status: DC | PRN

## 2020-02-01 MED ORDER — MENTHOL 3 MG MT LOZG
1.0000 | LOZENGE | OROMUCOSAL | Status: DC | PRN
Start: 2020-02-01 — End: 2020-02-04

## 2020-02-01 MED ORDER — OXYTOCIN-SODIUM CHLORIDE 30-0.9 UT/500ML-% IV SOLN
INTRAVENOUS | Status: DC | PRN
  Administered 2020-02-01: 30 [IU] via INTRAVENOUS

## 2020-02-01 MED ORDER — LEVOTHYROXINE SODIUM 50 MCG PO TABS: 25.0000 ug | ORAL_TABLET | ORAL | Status: DC

## 2020-02-01 MED ORDER — LEVOTHYROXINE SODIUM 50 MCG PO TABS
25.0000 ug | ORAL_TABLET | ORAL | Status: DC
  Administered 2020-02-03 – 2020-02-04 (×2): 25 ug via ORAL
  Filled 2020-02-01 (×2): qty 1

## 2020-02-01 MED ORDER — ACETAMINOPHEN 500 MG PO TABS
1000.0000 mg | ORAL_TABLET | Freq: Four times a day (QID) | ORAL | Status: AC
  Administered 2020-02-01 (×3): 1000 mg via ORAL
  Filled 2020-02-01 (×3): qty 2

## 2020-02-01 MED ORDER — FENTANYL CITRATE (PF) 100 MCG/2ML IJ SOLN
INTRAMUSCULAR | Status: AC
  Filled 2020-02-01: qty 2

## 2020-02-01 MED ORDER — NALBUPHINE HCL 10 MG/ML IJ SOLN: 5.0000 mg | Freq: Once | INTRAMUSCULAR | Status: DC | PRN

## 2020-02-01 MED ORDER — HYDROMORPHONE HCL 1 MG/ML IJ SOLN: 0.2000 mg | INTRAMUSCULAR | Status: DC | PRN

## 2020-02-01 MED ORDER — SOD CITRATE-CITRIC ACID 500-334 MG/5ML PO SOLN
ORAL | Status: AC
  Administered 2020-02-01: 30 mL via ORAL
  Filled 2020-02-01: qty 15

## 2020-02-01 MED ORDER — NALBUPHINE HCL 10 MG/ML IJ SOLN
5.0000 mg | Freq: Once | INTRAMUSCULAR | Status: DC | PRN
Start: 2020-02-01 — End: 2020-02-04

## 2020-02-01 MED ORDER — KETOROLAC TROMETHAMINE 30 MG/ML IJ SOLN: 30.0000 mg | Freq: Four times a day (QID) | INTRAMUSCULAR | Status: AC | PRN

## 2020-02-01 MED ORDER — MEPERIDINE HCL 25 MG/ML IJ SOLN: 6.2500 mg | INTRAMUSCULAR | Status: DC | PRN

## 2020-02-01 MED ORDER — OXYCODONE HCL 5 MG PO TABS: 5.0000 mg | ORAL_TABLET | ORAL | Status: DC | PRN

## 2020-02-01 MED ORDER — LACTASE 3000 UNITS PO TABS
6000.0000 [IU] | ORAL_TABLET | Freq: Three times a day (TID) | ORAL | Status: DC
  Administered 2020-02-01 – 2020-02-02 (×4): 6000 [IU] via ORAL
  Filled 2020-02-01 (×12): qty 2

## 2020-02-01 MED ORDER — ZOLPIDEM TARTRATE 5 MG PO TABS: 5.0000 mg | ORAL_TABLET | Freq: Every evening | ORAL | Status: DC | PRN

## 2020-02-01 MED ORDER — DIBUCAINE (PERIANAL) 1 % EX OINT: 1.0000 "application " | TOPICAL_OINTMENT | CUTANEOUS | Status: DC | PRN

## 2020-02-01 MED ORDER — SODIUM CHLORIDE 0.9% FLUSH: 3.0000 mL | INTRAVENOUS | Status: DC | PRN

## 2020-02-01 MED ORDER — PHENYLEPHRINE HCL-NACL 20-0.9 MG/250ML-% IV SOLN
INTRAVENOUS | Status: DC | PRN
  Administered 2020-02-01: 60 ug/min via INTRAVENOUS

## 2020-02-01 MED ORDER — CEFAZOLIN SODIUM-DEXTROSE 2-4 GM/100ML-% IV SOLN
2.0000 g | INTRAVENOUS | Status: AC
  Administered 2020-02-01: 2 g via INTRAVENOUS
  Filled 2020-02-01: qty 100

## 2020-02-01 SURGICAL SUPPLY — 40 items
BENZOIN TINCTURE PRP APPL 2/3 (GAUZE/BANDAGES/DRESSINGS) ×2 IMPLANT
CHLORAPREP W/TINT 26ML (MISCELLANEOUS) ×2 IMPLANT
CLAMP CORD UMBIL (MISCELLANEOUS) IMPLANT
CLOSURE STERI-STRIP 1/4X4 (GAUZE/BANDAGES/DRESSINGS) ×2 IMPLANT
CLOTH BEACON ORANGE TIMEOUT ST (SAFETY) ×2 IMPLANT
DERMABOND ADVANCED (GAUZE/BANDAGES/DRESSINGS)
DERMABOND ADVANCED .7 DNX12 (GAUZE/BANDAGES/DRESSINGS) IMPLANT
DRSG OPSITE POSTOP 4X10 (GAUZE/BANDAGES/DRESSINGS) ×2 IMPLANT
ELECT REM PT RETURN 9FT ADLT (ELECTROSURGICAL) ×2
ELECTRODE REM PT RTRN 9FT ADLT (ELECTROSURGICAL) ×1 IMPLANT
EXTRACTOR VACUUM KIWI (MISCELLANEOUS) IMPLANT
GLOVE BIO SURGEON STRL SZ 6 (GLOVE) ×2 IMPLANT
GLOVE BIOGEL PI IND STRL 6 (GLOVE) ×2 IMPLANT
GLOVE BIOGEL PI IND STRL 7.0 (GLOVE) ×1 IMPLANT
GLOVE BIOGEL PI INDICATOR 6 (GLOVE) ×2
GLOVE BIOGEL PI INDICATOR 7.0 (GLOVE) ×1
GOWN STRL REUS W/TWL LRG LVL3 (GOWN DISPOSABLE) ×4 IMPLANT
HEMOSTAT ARISTA ABSORB 3G PWDR (HEMOSTASIS) ×2 IMPLANT
KIT ABG SYR 3ML LUER SLIP (SYRINGE) ×2 IMPLANT
NEEDLE HYPO 25X5/8 SAFETYGLIDE (NEEDLE) ×2 IMPLANT
NS IRRIG 1000ML POUR BTL (IV SOLUTION) ×2 IMPLANT
PACK C SECTION WH (CUSTOM PROCEDURE TRAY) ×2 IMPLANT
PAD ABD 8X7 1/2 STERILE (GAUZE/BANDAGES/DRESSINGS) ×2 IMPLANT
PAD OB MATERNITY 4.3X12.25 (PERSONAL CARE ITEMS) ×2 IMPLANT
PENCIL SMOKE EVAC W/HOLSTER (ELECTROSURGICAL) ×2 IMPLANT
SPONGE GAUZE 4X4 STERILE 39 (GAUZE/BANDAGES/DRESSINGS) ×2 IMPLANT
STRIP CLOSURE SKIN 1/2X4 (GAUZE/BANDAGES/DRESSINGS) IMPLANT
SUT CHROMIC 0 CTX 36 (SUTURE) ×6 IMPLANT
SUT MON AB 2-0 CT1 27 (SUTURE) ×2 IMPLANT
SUT PDS AB 0 CT1 27 (SUTURE) IMPLANT
SUT PLAIN 0 NONE (SUTURE) IMPLANT
SUT VIC AB 0 CT1 36 (SUTURE) IMPLANT
SUT VIC AB 0 CTX 36 (SUTURE) ×2
SUT VIC AB 0 CTX36XBRD ANBCTRL (SUTURE) ×2 IMPLANT
SUT VIC AB 3-0 SH 27 (SUTURE) ×2
SUT VIC AB 3-0 SH 27X BRD (SUTURE) ×2 IMPLANT
SUT VIC AB 4-0 KS 27 (SUTURE) IMPLANT
TOWEL OR 17X24 6PK STRL BLUE (TOWEL DISPOSABLE) ×2 IMPLANT
TRAY FOLEY W/BAG SLVR 14FR LF (SET/KITS/TRAYS/PACK) IMPLANT
WATER STERILE IRR 1000ML POUR (IV SOLUTION) ×2 IMPLANT

## 2020-02-01 NOTE — MAU Provider Note (Signed)
Chief Complaint:  Vaginal Bleeding      HPI  HPI: Kirsten Hunt is a 35 y.o. G3P2002 at 79w0dwho presents to maternity admissions reporting vaginal bleeding this morning. Is not feeling any contractions (though we see them on monitor).  Had had 3 admissions for bleeding with this placenta previa.  . She reports decreased fetal movement, denies LOF, vaginal itching/burning, urinary symptoms, h/a, dizziness, n/v, diarrhea, constipation or fever/chills.     Past Medical History: Past Medical History:  Diagnosis Date  . Complication of anesthesia   . History of vertigo   . Hx of varicella   . PONV (postoperative nausea and vomiting)     Past obstetric history: OB History  Gravida Para Term Preterm AB Living  3 2 2     2   SAB IAB Ectopic Multiple Live Births        0 2    # Outcome Date GA Lbr Len/2nd Weight Sex Delivery Anes PTL Lv  3 Current           2 Term 12/26/17 [redacted]w[redacted]d 03:49 / 00:32 3820 g F Vag-Spont EPI  LIV  1 Term 04/25/15 [redacted]w[redacted]d 09:23 / 01:12 3175 g F Vag-Spont EPI  LIV     Birth Comments: none    Past Surgical History: Past Surgical History:  Procedure Laterality Date  . WISDOM TOOTH EXTRACTION      Family History: Family History  Problem Relation Age of Onset  . Thyroid disease Mother   . Migraines Mother   . Thyroid disease Maternal Aunt   . Migraines Maternal Aunt   . Cancer Paternal Aunt   . Hypertension Paternal Grandmother   . Marfan syndrome Maternal Grandfather     Social History: Social History   Tobacco Use  . Smoking status: Never Smoker  . Smokeless tobacco: Never Used  Vaping Use  . Vaping Use: Never used  Substance Use Topics  . Alcohol use: Not Currently    Comment: Occasional wine  . Drug use: No    Allergies:  Allergies  Allergen Reactions  . Lactose Intolerance (Gi) Other (See Comments)    Gas, stomach cramps    Meds:  Medications Prior to Admission  Medication Sig Dispense Refill Last Dose  . cholecalciferol  (VITAMIN D3) 25 MCG (1000 UNIT) tablet Take 1,000 Units by mouth daily.   01/31/2020 at Unknown time  . docusate sodium (COLACE) 100 MG capsule Take 100 mg by mouth daily.   01/31/2020 at Unknown time  . ferrous sulfate 325 (65 FE) MG tablet Take 325 mg by mouth 2 (two) times daily with a meal.   01/31/2020 at Unknown time  . lactase (LACTAID) 3000 units tablet Take 3,000 Units by mouth daily as needed (to prevent stomach cramps prior to a meal).   Past Week at Unknown time  . levothyroxine (SYNTHROID) 25 MCG tablet Take 25-50 mcg by mouth See admin instructions. 25 mcg daily, except on Saturday 50 mcg   01/31/2020 at Unknown time  . Prenatal Vit-Fe Fumarate-FA (PRENATAL MULTIVITAMIN) TABS tablet Take 1 tablet by mouth daily at 12 noon.   01/31/2020 at Unknown time  . calcium carbonate (TUMS - DOSED IN MG ELEMENTAL CALCIUM) 500 MG chewable tablet Chew 1-2 tablets by mouth daily as needed for indigestion or heartburn.       I have reviewed patient's Past Medical Hx, Surgical Hx, Family Hx, Social Hx, medications and allergies.   ROS:  Review of Systems  Constitutional: Negative for chills and  fever.  Respiratory: Negative for shortness of breath.   Gastrointestinal: Negative for abdominal pain, constipation, diarrhea and nausea.  Genitourinary: Positive for vaginal bleeding. Negative for pelvic pain.  Neurological: Negative for dizziness.   Other systems negative  Physical Exam   Patient Vitals for the past 24 hrs:  BP Temp Temp src Pulse Resp SpO2  02/01/20 0510 115/79 98.4 F (36.9 C) Oral 97 20 100 %  02/01/20 0503 - - - - - 100 %   Constitutional: Well-developed, well-nourished female in no acute distress.  Cardiovascular: normal rate and rhythm Respiratory: normal effort GI: Abd soft, non-tender, gravid appropriate for gestational age.   No rebound or guarding. MS: Extremities nontender, no edema, normal ROM Neurologic: Alert and oriented x 4.  GU: Neg CVAT.  PELVIC EXAM: Cervix  pink, visually closed, without lesion, Moderate blood in vault with clots.  No active hemorrhage but has been passing clots at home as well.      FHT:  Baseline 140 , moderate variability, accelerations present, several variable decelerations Contractions: q 2-4 mins Irregular    Labs: Pre=op labs sent Covid pending --/--/A POS (12/22 0551)  Imaging:  No results found.  MAU Course/MDM: I have ordered labs and released preop orders already placed by Dr Langston Masker.  NST reviewed, reassuring but after decels there was overshoot tachycardia with decreased variability Consult Dr Elon Spanner with presentation, exam findings and test results.    Assessment: Single IUP at [redacted]w[redacted]d Placenta Previa Moderate vaginal bleeding Preterm uterine contractions  Plan: Admit for Cesarean Delivery MD to follow  Wynelle Bourgeois CNM, MSN Certified Nurse-Midwife 02/01/2020 5:35 AM

## 2020-02-01 NOTE — Op Note (Signed)
PROCEDURE DATE: 02/01/20   PREOPERATIVE DIAGNOSIS: bleeding placenta previa   POSTOPERATIVE DIAGNOSIS: The same   PROCEDURE:    Primary Low Transverse Cesarean Section   SURGEON:  Dr. Belva Agee   INDICATIONS: This is a 34yo [id number] ar 35.0 wga requiring cesarean section secondary to bleeding placental previa. She presented with her fourth episode of bleeding with recurrent decels and regular contractions.  Decision made to proceed with LTCS. The risks of cesarean section discussed with the patient included but were not limited to: bleeding which may require transfusion or reoperation; infection which may require antibiotics; injury to bowel, bladder, ureters or other surrounding organs; injury to the fetus; need for additional procedures including hysterectomy in the event of a life-threatening hemorrhage; placental abnormalities wth subsequent pregnancies, incisional problems, thromboembolic phenomenon and other postoperative/anesthesia complications. The patient agreed with the proposed plan, giving informed consent for the procedure.     FINDINGS:  Viable female infant in vertex presentation, APGARs pending,  Weight pending, Amniotic fluid bloody,  Intact placenta, three vessel cord.  Grossly normal uterus .   ANESTHESIA:  Spinal ESTIMATED BLOOD LOSS: 404cc SPECIMENS: Placenta for pathology COMPLICATIONS: None immediate   PROCEDURE IN DETAIL:  The patient received intravenous antibiotics (2g Ancef) and had sequential compression devices applied to her lower extremities while in the preoperative area.  She was then taken to the operating room where epidural anesthesia was dosed up to surgical level and was found to be adequate. She was then placed in a dorsal supine position with a leftward tilt, and prepped and draped in a sterile manner.  A foley catheter was placed into her bladder and attached to constant gravity.  After an adequate timeout was performed, a Pfannenstiel skin incision was  made with scalpel and carried through to the underlying layer of fascia. The fascia was incised in the midline and this incision was extended bilaterally using the Mayo scissors. Kocher clamps were applied to the superior aspect of the fascial incision and the underlying rectus muscles were dissected off bluntly. A similar process was carried out on the inferior aspect of the facial incision. The rectus muscles were separated in the midline bluntly and the peritoneum was entered bluntly.  A bladder flap was created sharply and developed bluntly. A transverse hysterotomy was made with a scalpel and extended bilaterally bluntly. The bladder blade was then removed. The infant was successfully delivered, and cord was clamped and cut and infant was handed over to awaiting neonatology team. Uterine massage was then administered and the placenta delivered intact with three-vessel cord. Cord gases were taken. The uterus was cleared of clot and debris.  The hysterotomy was closed with 0 vicryl.  A second imbricating suture of 0-vicryl was used to reinforce the incision and aid in hemostasis. Continued oozing at LLUS requiring several single stitches to achieve hemostasis. Arista placed. The fascia was closed with 0-Vicryl in a running fashion with good restoration of anatomy.  The subcutaneus tissue was irrigated and was reapproximated using three interrupted plain gut stitches.  The skin was closed with 4-0 Vicryl in a subcuticular fashion.  All surgical site and was hemostatic at end of procedure) without any further bleeding on exam.    It's a boy - "Luisa Hart!!" To join sisters Lanora Manis and [place].    Pt tolerated the procedure well. All sponge/lap/needle counts were correct  X 2. Pt taken to recovery room in stable condition.     Belva Agee MD

## 2020-02-01 NOTE — Anesthesia Procedure Notes (Signed)
Spinal  Patient location during procedure: OR Start time: 02/01/2020 6:30 AM End time: 02/01/2020 6:34 AM Staffing Performed: anesthesiologist  Anesthesiologist: Marcene Duos, MD Preanesthetic Checklist Completed: patient identified, IV checked, site marked, risks and benefits discussed, surgical consent, monitors and equipment checked, pre-op evaluation and timeout performed Spinal Block Patient position: sitting Prep: DuraPrep Patient monitoring: heart rate, cardiac monitor, continuous pulse ox and blood pressure Approach: midline Location: L4-5 Injection technique: single-shot Needle Needle type: Pencan  Needle gauge: 24 G Needle length: 9 cm Assessment Sensory level: T4

## 2020-02-01 NOTE — MAU Note (Signed)
OB Anesthesia, Dr. Sampson Goon, notified and report given at [phone].  OB OR Charge Nurse, Abbott Pao, notified and report given at [phone].  [facility] notified at 66.   Awaiting the arrival of Dr. Elon Spanner to proceed with C/S.

## 2020-02-01 NOTE — MAU Note (Signed)
Pt presents to MAU for vaginal bleeding, pt endorses FM but states has been less than usual.  Pt denies contractions.

## 2020-02-01 NOTE — Anesthesia Preprocedure Evaluation (Signed)
Anesthesia Evaluation  Patient identified by MRN, date of birth, ID band Patient awake    Reviewed: Allergy & Precautions, NPO status , Patient's Chart, lab work & pertinent test results  Airway Mallampati: II  TM Distance: >3 FB     Dental   Pulmonary neg pulmonary ROS,    breath sounds clear to auscultation       Cardiovascular negative cardio ROS   Rhythm:Regular Rate:Normal     Neuro/Psych negative neurological ROS     GI/Hepatic negative GI ROS, Neg liver ROS,   Endo/Other  negative endocrine ROS  Renal/GU negative Renal ROS     Musculoskeletal   Abdominal   Peds  Hematology negative hematology ROS (+)   Anesthesia Other Findings   Reproductive/Obstetrics (+) Pregnancy                             Anesthesia Physical Anesthesia Plan  ASA: II  Anesthesia Plan: Spinal   Post-op Pain Management:    Induction:   PONV Risk Score and Plan: 2 and Ondansetron, Dexamethasone and Treatment may vary due to age or medical condition  Airway Management Planned: Natural Airway  Additional Equipment:   Intra-op Plan:   Post-operative Plan:   Informed Consent: I have reviewed the patients History and Physical, chart, labs and discussed the procedure including the risks, benefits and alternatives for the proposed anesthesia with the patient or authorized representative who has indicated his/her understanding and acceptance.       Plan Discussed with:   Anesthesia Plan Comments:         Anesthesia Quick Evaluation

## 2020-02-01 NOTE — H&P (Addendum)
Kirsten Hunt is a 35 y.o. female presenting for heavy vaginal bleeding and contractions in the setting of a placenta previa. She has been hospitalized multiple times s/s this issue. Patient received BMZ 2/2 and 2/3.  Pregnancy is product of IVF and PGT was done on embryo.  Patient declined fetal ECHO.  Antepartum course otherwise uncomplicated.  OB History    Gravida  3   Para  2   Term  2   Preterm      AB      Living  2     SAB      IAB      Ectopic      Multiple  0   Live Births  2          Past Medical History:  Diagnosis Date  . Complication of anesthesia   . History of vertigo   . Hx of varicella   . PONV (postoperative nausea and vomiting)    Past Surgical History:  Procedure Laterality Date  . WISDOM TOOTH EXTRACTION     Family History: family history includes Cancer in her paternal aunt; Hypertension in her paternal grandmother; Marfan syndrome in her maternal grandfather; Migraines in her maternal aunt and mother; Thyroid disease in her maternal aunt and mother. Social History:  reports that she has never smoked. She has never used smokeless tobacco. She reports previous alcohol use. She reports that she does not use drugs.     Maternal Diabetes: No Genetic Screening: Normal Maternal Ultrasounds/Referrals: Normal Fetal Ultrasounds or other Referrals:  None Maternal Substance Abuse:  No Significant Maternal Medications:  None Significant Maternal Lab Results:  None Other Comments:  None  Review of Systems  Genitourinary: Positive for vaginal bleeding and vaginal pain.   Maternal Medical History:  Prenatal complications: Placental abnormality.   Prenatal Complications - Diabetes: none.      Blood pressure (!) 123/98, pulse (!) 150, temperature 98.4 F (36.9 C), temperature source Oral, resp. rate 20, height 5\' 6"  (1.676 m), weight 86.2 kg, SpO2 100 %. Maternal Exam:  Abdomen: Patient reports no abdominal tenderness. Fundal height is  c/w dates.   Estimated fetal weight is 7#.       Physical Exam Constitutional:      Appearance: Normal appearance.  HENT:     Head: Normocephalic and atraumatic.  Pulmonary:     Effort: Pulmonary effort is normal.  Abdominal:     Palpations: Abdomen is soft.  Musculoskeletal:        General: Normal range of motion.     Cervical back: Normal range of motion and neck supple.  Skin:    General: Skin is warm and dry.  Neurological:     Mental Status: She is alert and oriented to person, place, and time.  Psychiatric:        Mood and Affect: Mood normal.        Behavior: Behavior normal.     Prenatal labs: ABO, Rh: --/--/PENDING (01/28 02-04-1987) Antibody: PENDING (01/28 0516) Rubella: Immune (07/27 0000) RPR: Nonreactive (07/27 0000)  HBsAg: Negative (07/27 0000)  HIV: Non Reactive (11/18 2217)  GBS:     Assessment/Plan: 2218 presenting with vaginal bleeding,, labor, and nrFHT. Recommend primary C/S secondary to placenta previa with vaginal bleeding and nrFHT. Patient has been counseled re: risk of bleeding, infection, scarring, hysterectomy, blood transfusion, and damage to surrounding structures.  She is informed of implications in future pregnancies including uterine rupture and abnormal placentation.  All  questions have been answered and patient wishes to proceed. Antibiotics on call to OR.    Kirsten Hunt 02/01/2020, 6:00 AM

## 2020-02-02 ENCOUNTER — Encounter (HOSPITAL_COMMUNITY): Payer: Self-pay | Admitting: Obstetrics & Gynecology

## 2020-02-02 LAB — CBC
HCT: 31.8 % — ABNORMAL LOW (ref 36.0–46.0)
Hemoglobin: 10.4 g/dL — ABNORMAL LOW (ref 12.0–15.0)
MCH: 31.7 pg (ref 26.0–34.0)
MCHC: 32.7 g/dL (ref 30.0–36.0)
MCV: 97 fL (ref 80.0–100.0)
Platelets: 188 10*3/uL (ref 150–400)
RBC: 3.28 MIL/uL — ABNORMAL LOW (ref 3.87–5.11)
RDW: 12.4 % (ref 11.5–15.5)
WBC: 14.3 10*3/uL — ABNORMAL HIGH (ref 4.0–10.5)
nRBC: 0 % (ref 0.0–0.2)

## 2020-02-02 MED ORDER — ACETAMINOPHEN 500 MG PO TABS
1000.0000 mg | ORAL_TABLET | Freq: Four times a day (QID) | ORAL | Status: DC | PRN
  Administered 2020-02-02: 1000 mg via ORAL
  Filled 2020-02-02: qty 2

## 2020-02-02 NOTE — Transfer of Care (Signed)
Immediate Anesthesia Transfer of Care Note  Patient: Kirsten Hunt  Procedure(s) Performed: PRIMARY CESAREAN SECTION EDC: 03-07-20  ALLERGIES: NKDA (N/A )  Patient Location: PACU  Anesthesia Type:Spinal  Level of Consciousness: awake, alert  and oriented  Airway & Oxygen Therapy: Patient Spontanous Breathing  Post-op Assessment: Report given to RN and Post -op Vital signs reviewed and stable  Post vital signs: Reviewed and stable  Last Vitals:  Vitals Value Taken Time  BP 102/52 02/01/20 0913  Temp 36.3 C 02/01/20 0913  Pulse 83 02/01/20 0913  Resp 15 02/01/20 0913  SpO2 100 % 02/01/20 0913    Last Pain:  Vitals:   02/02/20 0312  TempSrc: Oral  PainSc:          Complications: No complications documented.

## 2020-02-02 NOTE — Progress Notes (Signed)
Subjective: Postpartum Day 1: Cesarean Delivery Patient reports tolerating PO, + flatus and no problems voiding.   Patient states she believes they had Covid a "few weeks ago" but did not test. Today no SOB, no cough, no chills. Objective: Vital signs in last 24 hours: Temp:  [97.4 F (36.3 C)-98.2 F (36.8 C)] 98.2 F (36.8 C) (01/29 0312) Pulse Rate:  [65-83] 65 (01/29 0312) Resp:  [12-18] 18 (01/28 2153) BP: (92-109)/(52-73) 92/58 (01/29 0312) SpO2:  [100 %] 100 % (01/28 1730)  Physical Exam:  General: alert, cooperative and no distress Lochia: appropriate Uterine Fundus: firm Incision: healing well DVT Evaluation: No evidence of DVT seen on physical exam.  Recent Labs    02/01/20 0516 02/02/20 0353  HGB 12.5 10.4*  HCT 38.3 31.8*    Assessment/Plan: Status post Cesarean section. Doing well postoperatively.  Continue current care. Covid positive-asymptomatic, possible positive from previous undiagnosed infection. Continue isolation precautions Roselle Locus II 02/02/2020, 8:45 AM

## 2020-02-02 NOTE — Anesthesia Postprocedure Evaluation (Signed)
Anesthesia Post Note  Patient: Kirsten Hunt  Procedure(s) Performed: PRIMARY CESAREAN SECTION EDC: 03-07-20  ALLERGIES: NKDA (N/A )     Patient location during evaluation: PACU Anesthesia Type: Spinal Level of consciousness: oriented and awake and alert Pain management: pain level controlled Vital Signs Assessment: post-procedure vital signs reviewed and stable Respiratory status: spontaneous breathing and respiratory function stable Cardiovascular status: blood pressure returned to baseline and stable Postop Assessment: no headache, no backache, no apparent nausea or vomiting and spinal receding Anesthetic complications: no   No complications documented.  Last Vitals:  Vitals:   02/01/20 2153 02/02/20 0312  BP: 99/73 (!) 92/58  Pulse: 66 65  Resp: 18   Temp: 36.7 C 36.8 C  SpO2:      Last Pain:  Vitals:   02/02/20 0312  TempSrc: Oral  PainSc:    Pain Goal:                   Lannie Fields

## 2020-02-03 MED ORDER — DOCUSATE SODIUM 100 MG PO CAPS
100.0000 mg | ORAL_CAPSULE | Freq: Every day | ORAL | Status: DC
Start: 2020-02-03 — End: 2020-02-04
  Administered 2020-02-04: 100 mg via ORAL
  Filled 2020-02-03: qty 1

## 2020-02-03 NOTE — Plan of Care (Signed)

## 2020-02-03 NOTE — Progress Notes (Signed)
Subjective: Postpartum Day 2: Cesarean Delivery Patient reports tolerating PO, + flatus, + BM and no problems voiding.   Had BM after a lot of cramping with Senokot. Wants Colace instead Iron is rough on her stomach No cough, no SOB Objective: Vital signs in last 24 hours: Temp:  [98 F (36.7 C)-98.1 F (36.7 C)] 98 F (36.7 C) (01/30 0645) Pulse Rate:  [61-67] 67 (01/30 0645) Resp:  [16-18] 16 (01/30 0645) BP: (99-100)/(54-57) 100/57 (01/30 0645) SpO2:  [99 %-100 %] 99 % (01/30 0645)  Physical Exam:  General: alert, cooperative and no distress Lochia: appropriate Uterine Fundus: firm Incision: healing well DVT Evaluation: No evidence of DVT seen on physical exam.  Recent Labs    02/01/20 0516 02/02/20 0353  HGB 12.5 10.4*  HCT 38.3 31.8*    Assessment/Plan: Status post Cesarean section. Doing well postoperatively.  Continue current care. Peds wants to hold on circumcision today. Baby needs to feed better.  Kirsten Hunt 02/03/2020, 9:53 AM

## 2020-02-04 LAB — SURGICAL PATHOLOGY

## 2020-02-04 MED ORDER — DOCUSATE SODIUM 100 MG PO CAPS: 100.0000 mg | ORAL_CAPSULE | Freq: Two times a day (BID) | ORAL | 2 refills | Status: DC

## 2020-02-04 MED ORDER — OXYCODONE HCL 5 MG PO TABS: 5.0000 mg | ORAL_TABLET | ORAL | 0 refills | Status: DC | PRN

## 2020-02-04 MED ORDER — IBUPROFEN 600 MG PO TABS: 600.0000 mg | ORAL_TABLET | Freq: Four times a day (QID) | ORAL | 0 refills | Status: DC | PRN

## 2020-02-04 NOTE — Discharge Summary (Signed)
   Postpartum Discharge Summary  Date of Service updated 02/04/20     Patient Name: Kirsten Hunt DOB: 06/04/1985 MRN: [id number]  Date of admission: 02/01/2020 Delivery date:02/01/2020  Delivering provider: LEGER, ELISE JENNIFER  Date of discharge: 02/04/2020  Admitting diagnosis: Placenta previa [O44.00] Intrauterine pregnancy: [redacted]w[redacted]d     Secondary diagnosis:  Active Problems:   Placenta previa  Additional problems: bleeding placenta previa, nrFHT    Discharge diagnosis: Preterm Pregnancy Delivered                                              Post partum procedures:none Augmentation: N/A Complications: None  Hospital course: Onset of Labor With Unplanned C/S   35 y.o. yo G3P2002 at [redacted]w[redacted]d was admitted in Latent Labor on 02/01/2020. She was noted to be contracting on the monitor with heavy vaginal bleeding. The patient went for cesarean section due to Previa, Non-Reassuring FHR and bleeding. Delivery details as follows: Membrane Rupture Time/Date: 6:52 AM ,02/01/2020   Delivery Method:C-Section, Low Transverse  Details of operation can be found in separate operative note. Patient had an uncomplicated postpartum course.  She is ambulating,tolerating a regular diet, passing flatus, and urinating well.  Patient is discharged home in stable condition 02/04/20.  Newborn Data: Birth date:02/01/2020  Birth time:6:52 AM  Gender:Female  Living status:Living  Apgars:8 ,9  Weight:2490 g   Magnesium Sulfate received: No BMZ received: Yes  Prior to this admission. Rhophylac:N/A MMR:N/A T-DaP:Given prenatally Flu: N/A Transfusion:No  Physical exam  Vitals:   02/03/20 0645 02/03/20 1433 02/03/20 2104 02/04/20 0549  BP: (!) 100/57 (!) 102/50 (!) 96/55 109/69  Pulse: 67 80 76 74  Resp: 16 17 18 18  Temp: 98 F (36.7 C) 98 F (36.7 C) 97.9 F (36.6 C) 97.7 F (36.5 C)  TempSrc: Oral Oral Oral Oral  SpO2: 99% 97% 100% 99%  Weight:      Height:       General: alert Lochia:  appropriate Uterine Fundus: firm Incision: Healing well with no significant drainage DVT Evaluation: No evidence of DVT seen on physical exam. Labs: Lab Results  Component Value Date   WBC 14.3 (H) 02/02/2020   HGB 10.4 (L) 02/02/2020   HCT 31.8 (L) 02/02/2020   MCV 97.0 02/02/2020   PLT 188 02/02/2020   CMP Latest Ref Rng & Units 09/08/2016  Glucose 70 - 99 mg/dL 106(H)  BUN 6 - 23 mg/dL 11  Creatinine 0.40 - 1.20 mg/dL 0.75  Sodium 135 - 145 mEq/L 137  Potassium 3.5 - 5.1 mEq/L 3.9  Chloride 96 - 112 mEq/L 102  CO2 19 - 32 mEq/L 29  Calcium 8.4 - 10.5 mg/dL 9.2  Total Protein 6.0 - 8.3 g/dL 7.0  Total Bilirubin 0.2 - 1.2 mg/dL 0.6  Alkaline Phos 39 - 117 U/L 50  AST 0 - 37 U/L 19  ALT 0 - 35 U/L 10   Edinburgh Score: Edinburgh Postnatal Depression Scale Screening Tool 02/02/2020  I have been able to laugh and see the funny side of things. 0  I have looked forward with enjoyment to things. 0  I have blamed myself unnecessarily when things went wrong. 0  I have been anxious or worried for no good reason. 0  I have felt scared or panicky for no good reason. 0  Things have been getting on top of me.   0  I have been so unhappy that I have had difficulty sleeping. 0  I have felt sad or miserable. 0  I have been so unhappy that I have been crying. 0  The thought of harming myself has occurred to me. 0  Edinburgh Postnatal Depression Scale Total 0      After visit meds:  Allergies as of 02/04/2020      Reactions   Lactose Intolerance (gi) Other (See Comments)   Gas, stomach cramps      Medication List    STOP taking these medications   calcium carbonate 500 MG chewable tablet Commonly known as: TUMS - dosed in mg elemental calcium     TAKE these medications   cholecalciferol 25 MCG (1000 UNIT) tablet Commonly known as: VITAMIN D3 Take 1,000 Units by mouth daily.   docusate sodium 100 MG capsule Commonly known as: COLACE Take 100 mg by mouth daily. What changed:  Another medication with the same name was added. Make sure you understand how and when to take each.   docusate sodium 100 MG capsule Commonly known as: Colace Take 1 capsule (100 mg total) by mouth 2 (two) times daily. What changed: You were already taking a medication with the same name, and this prescription was added. Make sure you understand how and when to take each.   ferrous sulfate 325 (65 FE) MG tablet Take 325 mg by mouth 2 (two) times daily with a meal.   ibuprofen 600 MG tablet Commonly known as: ADVIL Take 1 tablet (600 mg total) by mouth every 6 (six) hours as needed.   lactase 3000 units tablet Commonly known as: LACTAID Take 3,000 Units by mouth daily as needed (to prevent stomach cramps prior to a meal).   levothyroxine 25 MCG tablet Commonly known as: SYNTHROID Take 25-50 mcg by mouth See admin instructions. 25 mcg daily, except on Saturday 50 mcg   oxyCODONE 5 MG immediate release tablet Commonly known as: Oxy IR/ROXICODONE Take 1 tablet (5 mg total) by mouth every 4 (four) hours as needed for severe pain.   prenatal multivitamin Tabs tablet Take 1 tablet by mouth daily at 12 noon.       D/W patient female infant circumcision, risks/benefits reviewed. All questions answered.  Discharge home in stable condition Infant Feeding: Breast Infant Disposition:home with mother Discharge instruction: per After Visit Summary and Postpartum booklet. Activity: Advance as tolerated. Pelvic rest for 6 weeks.  Diet: routine diet Anticipated Birth Control: Unsure Postpartum Appointment:6 weeks Additional Postpartum F/U: n/a Future Appointments:No future appointments. Follow up Visit:      02/04/2020 Tyson Dense, MD

## 2020-02-06 ENCOUNTER — Other Ambulatory Visit (HOSPITAL_COMMUNITY): Payer: BC Managed Care – PPO

## 2020-02-06 ENCOUNTER — Encounter (HOSPITAL_COMMUNITY)
Admission: RE | Admit: 2020-02-06 | Discharge: 2020-02-06 | Disposition: A | Discharge status: Home / Self Care | Payer: BC Managed Care – PPO | Source: Ambulatory Visit | Attending: Obstetrics & Gynecology | Admitting: Obstetrics & Gynecology

## 2020-02-08 ENCOUNTER — Inpatient Hospital Stay (HOSPITAL_COMMUNITY): Admit: 2020-02-08 | Payer: BC Managed Care – PPO | Admitting: Obstetrics & Gynecology

## 2020-03-10 DIAGNOSIS — Z1389 Encounter for screening for other disorder: Secondary | ICD-10-CM | POA: Diagnosis not present

## 2020-03-12 NOTE — Progress Notes (Signed)
[name] [address] [place] [address] Phone: [phone] Subjective:   Kirsten Hunt, am serving as a scribe for Dr. Antoine Primas. This visit occurred during the SARS-CoV-2 public health emergency.  Safety protocols were in place, including screening questions prior to the visit, additional usage of staff PPE, and extensive cleaning of exam room while observing appropriate contact time as indicated for disinfecting solutions.   I'm seeing this patient by the request  of:  Patient, No Pcp Per  CC: back pain follow up   [id number]  Kirsten Hunt is a 35 y.o. female coming in with complaint of back and neck pain. OMT 10/31/2019. Patient states that she feels tight throughout her back.  Patient is now doing relatively okay but is wanting to be more active.  Patient is having more tightness in the lower back when she tries to increase in activity.  Has noticed some mild knee pain here and there but nothing that stops her from activity only when she puts pressure on it.  Medications patient has been prescribed: None          Reviewed prior external information including notes and imaging from previsou exam, outside providers and external EMR if available.   As well as notes that were available from care everywhere and other healthcare systems.  Past medical history, social, surgical and family history all reviewed in electronic medical record.  No pertanent information unless stated regarding to the chief complaint.   Past Medical History:  Diagnosis Date  . Complication of anesthesia   . History of vertigo   . Hx of varicella   . PONV (postoperative nausea and vomiting)     Allergies  Allergen Reactions  . Lactose Intolerance (Gi) Other (See Comments)    Gas, stomach cramps     Review of Systems:  No headache, visual changes, nausea, vomiting, diarrhea, constipation, dizziness, abdominal pain, skin rash, fevers,  chills, night sweats, weight loss, swollen lymph nodes, body aches, joint swelling, chest pain, shortness of breath, mood changes. POSITIVE muscle aches  Objective  There were no vitals taken for this visit.   General: No apparent distress alert and oriented x3 mood and affect normal, dressed appropriately.  HEENT: Pupils equal, extraocular movements intact  Respiratory: Patient's speak in full sentences and does not appear short of breath  Cardiovascular: No lower extremity edema, non tender, no erythema  Patient does have pain more over the sacroiliac joint still right greater than left.  Negative straight leg test.  Mild tightness in the paraspinal musculature lumbar spine.  Osteopathic findings   T6 extended rotated and side bent right L2 flexed rotated and side bent right Sacrum right on right       Assessment and Plan:    Nonallopathic problems  Decision today to treat with OMT was based on Physical Exam  After verbal consent patient was treated with HVLA, ME, FPR techniques in  thoracic, lumbar, and sacral  areas  Patient tolerated the procedure well with improvement in symptoms  Patient given exercises, stretches and lifestyle modifications  See medications in patient instructions if given  Patient will follow up in 6 weeks      The above documentation has been reviewed and is accurate and complete Judi Saa, DO       Note: This dictation was prepared with Dragon dictation along with smaller phrase technology. Any transcriptional errors that result from this process are unintentional.

## 2020-03-13 ENCOUNTER — Other Ambulatory Visit: Payer: Self-pay

## 2020-03-13 ENCOUNTER — Encounter: Payer: Self-pay | Admitting: Family Medicine

## 2020-03-13 ENCOUNTER — Ambulatory Visit (INDEPENDENT_AMBULATORY_CARE_PROVIDER_SITE_OTHER): Payer: BC Managed Care – PPO | Admitting: Family Medicine

## 2020-03-13 VITALS — BP 106/82 | HR 66 | Ht 66.0 in | Wt 176.0 lb

## 2020-03-13 DIAGNOSIS — M533 Sacrococcygeal disorders, not elsewhere classified: Secondary | ICD-10-CM | POA: Diagnosis not present

## 2020-03-13 DIAGNOSIS — M999 Biomechanical lesion, unspecified: Secondary | ICD-10-CM

## 2020-03-13 NOTE — Patient Instructions (Signed)
Arnica Lotion  Please see me again in 5-6 weeks

## 2020-03-13 NOTE — Assessment & Plan Note (Signed)
Chronic but with mild exacerbation.  Has not seen patient in quite some time secondary to being on bed rest as well as with had a child recently.  Discussed with patient about core stability exercises.  Patient should do relatively well overall.  Patient will start see me on a more regular basis again.  Follow-up with me again 6 weeks

## 2020-03-17 ENCOUNTER — Ambulatory Visit: Payer: BC Managed Care – PPO | Admitting: Family Medicine

## 2020-03-26 ENCOUNTER — Ambulatory Visit: Payer: BC Managed Care – PPO | Admitting: Family Medicine

## 2020-04-04 ENCOUNTER — Encounter: Payer: Self-pay | Admitting: Family Medicine

## 2020-04-04 ENCOUNTER — Ambulatory Visit: Payer: BC Managed Care – PPO | Admitting: Family Medicine

## 2020-04-04 ENCOUNTER — Other Ambulatory Visit: Payer: Self-pay

## 2020-04-04 VITALS — BP 102/70 | HR 62 | Temp 97.9°F | Ht 66.0 in | Wt 179.8 lb

## 2020-04-04 DIAGNOSIS — Z0000 Encounter for general adult medical examination without abnormal findings: Secondary | ICD-10-CM

## 2020-04-04 DIAGNOSIS — Z1159 Encounter for screening for other viral diseases: Secondary | ICD-10-CM | POA: Diagnosis not present

## 2020-04-04 LAB — CBC WITH DIFFERENTIAL/PLATELET
Basophils Absolute: 0.1 10*3/uL (ref 0.0–0.1)
Basophils Relative: 1 % (ref 0.0–3.0)
Eosinophils Absolute: 0.2 10*3/uL (ref 0.0–0.7)
Eosinophils Relative: 4.1 % (ref 0.0–5.0)
HCT: 40 % (ref 36.0–46.0)
Hemoglobin: 13.3 g/dL (ref 12.0–15.0)
Lymphocytes Relative: 27 % (ref 12.0–46.0)
Lymphs Abs: 1.3 10*3/uL (ref 0.7–4.0)
MCHC: 33.1 g/dL (ref 30.0–36.0)
MCV: 92 fl (ref 78.0–100.0)
Monocytes Absolute: 0.4 10*3/uL (ref 0.1–1.0)
Monocytes Relative: 8.2 % (ref 3.0–12.0)
Neutro Abs: 3 10*3/uL (ref 1.4–7.7)
Neutrophils Relative %: 59.7 % (ref 43.0–77.0)
Platelets: 194 10*3/uL (ref 150.0–400.0)
RBC: 4.35 Mil/uL (ref 3.87–5.11)
RDW: 12.5 % (ref 11.5–15.5)
WBC: 5 10*3/uL (ref 4.0–10.5)

## 2020-04-04 LAB — COMPREHENSIVE METABOLIC PANEL
ALT: 13 U/L (ref 0–35)
AST: 17 U/L (ref 0–37)
Albumin: 4.4 g/dL (ref 3.5–5.2)
Alkaline Phosphatase: 41 U/L (ref 39–117)
BUN: 11 mg/dL (ref 6–23)
CO2: 28 mEq/L (ref 19–32)
Calcium: 9.7 mg/dL (ref 8.4–10.5)
Chloride: 104 mEq/L (ref 96–112)
Creatinine, Ser: 0.83 mg/dL (ref 0.40–1.20)
GFR: 90.95 mL/min (ref >60.00)
Glucose, Bld: 97 mg/dL (ref 70–99)
Potassium: 4.5 mEq/L (ref 3.5–5.1)
Sodium: 139 mEq/L (ref 135–145)
Total Bilirubin: 0.5 mg/dL (ref 0.2–1.2)
Total Protein: 7.3 g/dL (ref 6.0–8.3)

## 2020-04-04 LAB — LIPID PANEL
Cholesterol: 201 mg/dL — ABNORMAL HIGH (ref 0–200)
HDL: 77.5 mg/dL (ref >39.00)
LDL Cholesterol: 104 mg/dL — ABNORMAL HIGH (ref 0–99)
NonHDL: 123.4
Total CHOL/HDL Ratio: 3
Triglycerides: 97 mg/dL (ref 0.0–149.0)
VLDL: 19.4 mg/dL (ref 0.0–40.0)

## 2020-04-04 LAB — TSH: TSH: 1.61 u[IU]/mL (ref 0.35–4.50)

## 2020-04-04 LAB — HEPATITIS C ANTIBODY
Hepatitis C Ab: NONREACTIVE
SIGNAL TO CUT-OFF: 0.01 (ref <1.00)

## 2020-04-04 NOTE — Progress Notes (Signed)
Patient: Kirsten Hunt MRN: [id number] DOB: 04-21-1985 PCP: Patient, No Pcp Per (Inactive)     Subjective:  Chief Complaint  Patient presents with  . Follow-up    Transferring care. No issues.    HPI: The patient is a 35 y.o. female who presents today for annual exam. She denies any changes to past medical history. There have been no recent hospitalizations. They are not following a well balanced diet and exercise plan. Weight has been decreasing steadily. No complaints today. She is 9 weeks post partum. She is not breast feeding. She had a C/S secondary to previa which has healed well. She has no fever/drainage. denies any thoughts of depression.   No family hx of breast or colon cancer in first degree relative.   Immunization History  Administered Date(s) Administered  . Influenza,inj,Quad PF,6+ Mos 09/07/2016, 10/12/2018  . Moderna Sars-Covid-2 Vaccination 03/30/2019  . Tdap 01/21/2015   Colonoscopy: routine screening Mammogram: routine screening  Pap smear: within last 3 years. UTD   Review of Systems  Constitutional: Negative for chills, fatigue and fever.  HENT: Negative for dental problem, ear pain, hearing loss and trouble swallowing.   Eyes: Negative for visual disturbance.  Respiratory: Negative for cough, chest tightness and shortness of breath.   Cardiovascular: Negative for chest pain, palpitations and leg swelling.  Gastrointestinal: Negative for abdominal pain, blood in stool, diarrhea and nausea.  Endocrine: Negative for cold intolerance, polydipsia, polyphagia and polyuria.  Genitourinary: Negative for dysuria and hematuria.  Musculoskeletal: Negative for arthralgias.  Skin: Negative for rash.  Neurological: Negative for dizziness and headaches.  Psychiatric/Behavioral: Negative for dysphoric mood and sleep disturbance. The patient is not nervous/anxious.     Allergies Patient is allergic to lactose intolerance (gi).  Past Medical  History Patient  has a past medical history of Complication of anesthesia, History of vertigo, varicella, and PONV (postoperative nausea and vomiting).  Surgical History Patient  has a past surgical history that includes Wisdom tooth extraction and Cesarean section (N/A, 02/01/2020).  Family History Pateint's family history includes Cancer in her paternal aunt; Hypertension in her paternal grandmother; Marfan syndrome in her maternal grandfather; Migraines in her maternal aunt and mother; Thyroid disease in her maternal aunt and mother.  Social History Patient  reports that she has never smoked. She has never used smokeless tobacco. She reports previous alcohol use. She reports that she does not use drugs.    Objective: Vitals:   04/04/20 0957  BP: 102/70  Pulse: 62  Temp: 97.9 F (36.6 C)  SpO2: 99%  Weight: 179 lb 12.8 oz (81.6 kg)  Height: 5\' 6"  (1.676 m)    Body mass index is 29.02 kg/m.  Physical Exam Vitals reviewed.  Constitutional:      Appearance: Normal appearance. She is well-developed.  HENT:     Head: Normocephalic and atraumatic.     Right Ear: Tympanic membrane, ear canal and external ear normal.     Left Ear: Tympanic membrane, ear canal and external ear normal.     Nose: Nose normal.     Mouth/Throat:     Mouth: Mucous membranes are moist.  Eyes:     Conjunctiva/sclera: Conjunctivae normal.     Pupils: Pupils are equal, round, and reactive to light.  Neck:     Thyroid: No thyromegaly.     Vascular: No carotid bruit.  Cardiovascular:     Rate and Rhythm: Normal rate and regular rhythm.     Heart sounds: Normal heart sounds. No  murmur heard.   Pulmonary:     Effort: Pulmonary effort is normal.     Breath sounds: Normal breath sounds.  Abdominal:     General: Bowel sounds are normal. There is no distension.     Palpations: Abdomen is soft.     Tenderness: There is no abdominal tenderness.  Musculoskeletal:     Cervical back: Normal range of motion  and neck supple.  Lymphadenopathy:     Cervical: No cervical adenopathy.  Skin:    General: Skin is warm and dry.     Capillary Refill: Capillary refill takes less than 2 seconds.     Findings: No rash.  Neurological:     General: No focal deficit present.     Mental Status: She is alert and oriented to person, place, and time.     Cranial Nerves: No cranial nerve deficit.     Coordination: Coordination normal.     Deep Tendon Reflexes: Reflexes normal.  Psychiatric:        Mood and Affect: Mood normal.        Behavior: Behavior normal.        Flowsheet Row Office Visit from 04/04/2020 in [facility]  PHQ-2 Total Score 0      Assessment/plan: 1. Annual physical exam Routine labs today. She is not fasting. HM reviewed and UTD>. Do not have her pap smear records, but follows annually with gyn. She is post partum and will be released to exercise soon. Negative depression screen.  Doing well with baby 3. Eating healthy and will start exercise routine when she can. F/u in one year or as needed.  Patient counseling [x]    Nutrition: Stressed importance of moderation in sodium/caffeine intake, saturated fat and cholesterol, caloric balance, sufficient intake of fresh fruits, vegetables, fiber, calcium, iron, and 1 mg of folate supplement per day (for females capable of pregnancy).  [x]    Stressed the importance of regular exercise.   []    Substance Abuse: Discussed cessation/primary prevention of tobacco, alcohol, or other drug use; driving or other dangerous activities under the influence; availability of treatment for abuse.   [x]    Injury prevention: Discussed safety belts, safety helmets, smoke detector, smoking near bedding or upholstery.   [x]    Sexuality: Discussed sexually transmitted diseases, partner selection, use of condoms, avoidance of unintended pregnancy  and contraceptive alternatives.  [x]    Dental health: Discussed importance of regular tooth  brushing, flossing, and dental visits.  [x]    Health maintenance and immunizations reviewed. Please refer to Health maintenance section.     - CBC with Differential/Platelet - Lipid panel - TSH - Comprehensive metabolic panel  2. Encounter for hepatitis C screening test for low risk patient  - Hepatitis C antibody  This visit occurred during the SARS-CoV-2 public health emergency.  Safety protocols were in place, including screening questions prior to the visit, additional usage of staff PPE, and extensive cleaning of exam room while observing appropriate contact time as indicated for disinfecting solutions.     Return in about 1 year (around 04/04/2021) for annual .     , MD  [facility]  04/04/2020

## 2020-04-04 NOTE — Patient Instructions (Signed)
Preventive Care 21-36 Years Old, Female Preventive care refers to lifestyle choices and visits with your health care provider that can promote health and wellness. This includes:  A yearly physical exam. This is also called an annual wellness visit.  Regular dental and eye exams.  Immunizations.  Screening for certain conditions.  Healthy lifestyle choices, such as: ? Eating a healthy diet. ? Getting regular exercise. ? Not using drugs or products that contain nicotine and tobacco. ? Limiting alcohol use. What can I expect for my preventive care visit? Physical exam Your health care provider may check your:  Height and weight. These may be used to calculate your BMI (body mass index). BMI is a measurement that tells if you are at a healthy weight.  Heart rate and blood pressure.  Body temperature.  Skin for abnormal spots. Counseling Your health care provider may ask you questions about your:  Past medical problems.  Family's medical history.  Alcohol, tobacco, and drug use.  Emotional well-being.  Home life and relationship well-being.  Sexual activity.  Diet, exercise, and sleep habits.  Work and work environment.  Access to firearms.  Method of birth control.  Menstrual cycle.  Pregnancy history. What immunizations do I need? Vaccines are usually given at various ages, according to a schedule. Your health care provider will recommend vaccines for you based on your age, medical history, and lifestyle or other factors, such as travel or where you work.   What tests do I need? Blood tests  Lipid and cholesterol levels. These may be checked every 5 years starting at age 20.  Hepatitis C test.  Hepatitis B test. Screening  Diabetes screening. This is done by checking your blood sugar (glucose) after you have not eaten for a while (fasting).  STD (sexually transmitted disease) testing, if you are at risk.  BRCA-related cancer screening. This may be  done if you have a family history of breast, ovarian, tubal, or peritoneal cancers.  Pelvic exam and Pap test. This may be done every 3 years starting at age 21. Starting at age 30, this may be done every 5 years if you have a Pap test in combination with an HPV test. Talk with your health care provider about your test results, treatment options, and if necessary, the need for more tests.   Follow these instructions at home: Eating and drinking  Eat a healthy diet that includes fresh fruits and vegetables, whole grains, lean protein, and low-fat dairy products.  Take vitamin and mineral supplements as recommended by your health care provider.  Do not drink alcohol if: ? Your health care provider tells you not to drink. ? You are pregnant, may be pregnant, or are planning to become pregnant.  If you drink alcohol: ? Limit how much you have to 0-1 drink a day. ? Be aware of how much alcohol is in your drink. In the [place] one drink equals one 12 oz bottle of beer (355 mL), one 5 oz glass of wine (148 mL), or one 1 oz glass of hard liquor (44 mL).   Lifestyle  Take daily care of your teeth and gums. Brush your teeth every morning and night with fluoride toothpaste. Floss one time each day.  Stay active. Exercise for at least 30 minutes 5 or more days each week.  Do not use any products that contain nicotine or tobacco, such as cigarettes, e-cigarettes, and chewing tobacco. If you need help quitting, ask your health care provider.  Do not   use drugs.  If you are sexually active, practice safe sex. Use a condom or other form of protection to prevent STIs (sexually transmitted infections).  If you do not wish to become pregnant, use a form of birth control. If you plan to become pregnant, see your health care provider for a prepregnancy visit.  Find healthy ways to cope with stress, such as: ? Meditation, yoga, or listening to music. ? Journaling. ? Talking to a trusted  person. ? Spending time with friends and family. Safety  Always wear your seat belt while driving or riding in a vehicle.  Do not drive: ? If you have been drinking alcohol. Do not ride with someone who has been drinking. ? When you are tired or distracted. ? While texting.  Wear a helmet and other protective equipment during sports activities.  If you have firearms in your house, make sure you follow all gun safety procedures.  Seek help if you have been physically or sexually abused. What's next?  Go to your health care provider once a year for an annual wellness visit.  Ask your health care provider how often you should have your eyes and teeth checked.  Stay up to date on all vaccines. This information is not intended to replace advice given to you by your health care provider. Make sure you discuss any questions you have with your health care provider. Document Revised: 08/19/2019 Document Reviewed: 09/01/2017 Elsevier Patient Education  2021 Elsevier Inc.  

## 2020-04-29 ENCOUNTER — Ambulatory Visit (INDEPENDENT_AMBULATORY_CARE_PROVIDER_SITE_OTHER): Payer: BC Managed Care – PPO | Admitting: Family Medicine

## 2020-04-29 ENCOUNTER — Other Ambulatory Visit: Payer: Self-pay

## 2020-04-29 ENCOUNTER — Encounter: Payer: Self-pay | Admitting: Family Medicine

## 2020-04-29 VITALS — BP 110/74 | HR 63 | Ht 66.0 in | Wt 176.0 lb

## 2020-04-29 DIAGNOSIS — M9904 Segmental and somatic dysfunction of sacral region: Secondary | ICD-10-CM

## 2020-04-29 DIAGNOSIS — M9903 Segmental and somatic dysfunction of lumbar region: Secondary | ICD-10-CM | POA: Diagnosis not present

## 2020-04-29 DIAGNOSIS — M533 Sacrococcygeal disorders, not elsewhere classified: Secondary | ICD-10-CM

## 2020-04-29 NOTE — Assessment & Plan Note (Signed)
Continues to have some tightness in the sacroiliac joint.  Discussed treatment options as well as more strengthening for the hip abductors as well as the core stability with such things as planks.  Patient is not having any radicular symptoms.  I do see that patient will likely improve over the course of the next several weeks.  Follow-up with me again in 6 weeks

## 2020-04-29 NOTE — Patient Instructions (Signed)
Planks and mtn climber (sorry) Find something else to [employer] with other than me See me in 6 weeks

## 2020-04-29 NOTE — Progress Notes (Signed)
[name] [address] [place] [address] Phone: [phone] Subjective:   Kirsten Hunt, am serving as a scribe for Dr. Antoine Primas. This visit occurred during the SARS-CoV-2 public health emergency.  Safety protocols were in place, including screening questions prior to the visit, additional usage of staff PPE, and extensive cleaning of exam room while observing appropriate contact time as indicated for disinfecting solutions.   I'm seeing this patient by the request  of:  Patient, No Pcp Per (Inactive)  CC: Low back pain follow-up  [id number]  Kirsten Hunt is a 35 y.o. female coming in with complaint of back and neck pain. OMT 03/13/2020 Patient states   Medications patient has been prescribed: None     Patient did have delivery of the pregnancy in January 2022 last saw [place] March 14, 2018 2010     Reviewed prior external information including notes and imaging from previsou exam, outside providers and external EMR if available.   As well as notes that were available from care everywhere and other healthcare systems.  Past medical history, social, surgical and family history all reviewed in electronic medical record.  No pertanent information unless stated regarding to the chief complaint.   Past Medical History:  Diagnosis Date  . Complication of anesthesia   . History of vertigo   . Hx of varicella   . PONV (postoperative nausea and vomiting)     Allergies  Allergen Reactions  . Lactose Intolerance (Gi) Other (See Comments)    Gas, stomach cramps     Review of Systems:  No headache, visual changes, nausea, vomiting, diarrhea, constipation, dizziness, abdominal pain, skin rash, fevers, chills, night sweats, weight loss, swollen lymph nodes, body aches, joint swelling, chest pain, shortness of breath, mood changes. POSITIVE muscle aches  Objective  Blood pressure 110/74, pulse 63, height 5\' 6"  (1.676 m), weight 176 lb  (79.8 kg), SpO2 99 %.   General: No apparent distress alert and oriented x3 mood and affect normal, dressed appropriately.  HEENT: Pupils equal, extraocular movements intact  Respiratory: Patient's speak in full sentences and does not appear short of breath  Cardiovascular: No lower extremity edema, non tender, no erythema  Gait normal with good balance and coordination.  MSK:  Non tender with full range of motion and good stability and symmetric strength and tone of shoulders, elbows, wrist, hip, knee and ankles bilaterally.  Back -low back pain does have some mild loss of lordosis.  Tenderness to palpation mostly over the right sacroiliac joint.  Negative straight leg test.  5 out of 5 strength in lower extremities.  Deep tendon reflexes are intact.  Osteopathic findings  L2 flexed rotated and side bent right Sacrum right on right       Assessment and Plan:  SI (sacroiliac) joint dysfunction Continues to have some tightness in the sacroiliac joint.  Discussed treatment options as well as more strengthening for the hip abductors as well as the core stability with such things as planks.  Patient is not having any radicular symptoms.  I do see that patient will likely improve over the course of the next several weeks.  Follow-up with me again in 6 weeks   Nonallopathic problems  Decision today to treat with OMT was based on Physical Exam  After verbal consent patient was treated with HVLA, ME, FPR techniques in lumbar, and sacral  areas  Patient tolerated the procedure well with improvement in symptoms  Patient given exercises,  stretches and lifestyle modifications  See medications in patient instructions if given  Patient will follow up in 6 weeks      The above documentation has been reviewed and is accurate and complete Judi Saa, DO       Note: This dictation was prepared with Dragon dictation along with smaller phrase technology. Any transcriptional errors that  result from this process are unintentional.

## 2020-05-07 ENCOUNTER — Telehealth: Payer: Self-pay | Admitting: Family Medicine

## 2020-05-07 NOTE — Telephone Encounter (Signed)
Patient called asking if someone would be able to call her to go over the exercises she was given. She had a couple questions.  Please advise.

## 2020-05-07 NOTE — Telephone Encounter (Signed)
Spoke with patient about doing stretches after workouts.

## 2020-05-07 NOTE — Telephone Encounter (Signed)
Left message for patient to call back  

## 2020-06-18 NOTE — Progress Notes (Signed)
  [name] [address] [place] [address] Phone: [phone] Subjective:   Kirsten Hunt, am serving as a scribe for Dr. Antoine Primas.  I'm seeing this patient by the request  of:  Patient, No Pcp Per (Inactive)  CC: Low back pain  [id number]  Kirsten Hunt is a 35 y.o. female coming in with complaint of back and neck pain. OMT 04/29/2020. Patient states that she is feeling a little tight. No new complaints, but states she has been working on her core strength.    Medications patient has been prescribed: None        Past Medical History:  Diagnosis Date   Complication of anesthesia    History of vertigo    Hx of varicella    PONV (postoperative nausea and vomiting)     Allergies  Allergen Reactions   Lactose Intolerance (Gi) Other (See Comments)    Gas, stomach cramps     Review of Systems:  No headache, visual changes, nausea, vomiting, diarrhea, constipation, dizziness, abdominal pain, skin rash, fevers, chills, night sweats, weight loss, swollen lymph nodes, body aches, joint swelling, chest pain, shortness of breath, mood changes. POSITIVE muscle aches  Objective  Blood pressure 120/70, pulse 84, height 5\' 6"  (1.676 m), SpO2 98 %, unknown if currently breastfeeding.   General: No apparent distress alert and oriented x3 mood and affect normal, dressed appropriately.  HEENT: Pupils equal, extraocular movements intact  Respiratory: Patient's speak in full sentences and does not appear short of breath  Cardiovascular: No lower extremity edema, non tender, no erythema  Gait normal with good balance and coordination.  MSK:  Non tender with full range of motion and good stability and symmetric strength and tone of shoulders, elbows, wrist, hip, knee and ankles bilaterally.  Back -low back exam does have some tightness noted in the paraspinal musculature still mostly of the right sacroiliac joint.  Good range of motion noted in  the lumbar spine.  Tightness noted in the parascapular region on the right side.  No midline tenderness.  Osteopathic findings   T8 extended rotated and side bent right L2 flexed rotated and side bent right Sacrum right on right       Assessment and Plan:  SI (sacroiliac) joint dysfunction Patient did have tightness noted in the lower back again.  Continues to respond fairly well though to osteopathic manipulation.  Patient is can continue to work out.  Discussed different posture and ergonomics that I think will be beneficial.  Discussed home exercises.  Discussed avoiding certain activities where patient is put in provocative positioning.  Follow-up with me again 2 months   Nonallopathic problems  Decision today to treat with OMT was based on Physical Exam  After verbal consent patient was treated with HVLA, ME, FPR techniques in ,thoracic  lumbar, and sacral  areas  Patient tolerated the procedure well with improvement in symptoms  Patient given exercises, stretches and lifestyle modifications  See medications in patient instructions if given  Patient will follow up in 6-8 weeks     The above documentation has been reviewed and is accurate and complete , DO        Note: This dictation was prepared with Dragon dictation along with smaller phrase technology. Any transcriptional errors that result from this process are unintentional.

## 2020-06-19 ENCOUNTER — Encounter: Payer: Self-pay | Admitting: Family Medicine

## 2020-06-19 ENCOUNTER — Ambulatory Visit (INDEPENDENT_AMBULATORY_CARE_PROVIDER_SITE_OTHER): Payer: BC Managed Care – PPO | Admitting: Family Medicine

## 2020-06-19 ENCOUNTER — Other Ambulatory Visit: Payer: Self-pay

## 2020-06-19 DIAGNOSIS — M999 Biomechanical lesion, unspecified: Secondary | ICD-10-CM

## 2020-06-19 DIAGNOSIS — M533 Sacrococcygeal disorders, not elsewhere classified: Secondary | ICD-10-CM | POA: Diagnosis not present

## 2020-06-19 NOTE — Patient Instructions (Addendum)
Good to see you  Good luck with new car See me again in 6-8 weeks

## 2020-06-19 NOTE — Assessment & Plan Note (Signed)
Patient did have tightness noted in the lower back again.  Continues to respond fairly well though to osteopathic manipulation.  Patient is can continue to work out.  Discussed different posture and ergonomics that I think will be beneficial.  Discussed home exercises.  Discussed avoiding certain activities where patient is put in provocative positioning.  Follow-up with me again 2 months

## 2020-08-05 ENCOUNTER — Ambulatory Visit (INDEPENDENT_AMBULATORY_CARE_PROVIDER_SITE_OTHER): Payer: BC Managed Care – PPO | Admitting: Family Medicine

## 2020-08-05 ENCOUNTER — Other Ambulatory Visit: Payer: Self-pay

## 2020-08-05 ENCOUNTER — Encounter: Payer: Self-pay | Admitting: Family Medicine

## 2020-08-05 VITALS — BP 110/76 | HR 95 | Ht 66.0 in | Wt 162.0 lb

## 2020-08-05 DIAGNOSIS — M9903 Segmental and somatic dysfunction of lumbar region: Secondary | ICD-10-CM | POA: Diagnosis not present

## 2020-08-05 DIAGNOSIS — M25552 Pain in left hip: Secondary | ICD-10-CM | POA: Diagnosis not present

## 2020-08-05 DIAGNOSIS — M533 Sacrococcygeal disorders, not elsewhere classified: Secondary | ICD-10-CM | POA: Diagnosis not present

## 2020-08-05 DIAGNOSIS — M9904 Segmental and somatic dysfunction of sacral region: Secondary | ICD-10-CM

## 2020-08-05 DIAGNOSIS — M9902 Segmental and somatic dysfunction of thoracic region: Secondary | ICD-10-CM | POA: Diagnosis not present

## 2020-08-05 NOTE — Progress Notes (Signed)
  [name] [address] [place] [address] Phone: [phone] Subjective:   Kirsten Hunt, am serving as a scribe for Dr. Antoine Primas.  I'm seeing this patient by the request  of:  Patient, No Pcp Per (Inactive)  CC: Back pain follow-up  [id number]  Kirsten Hunt is a 35 y.o. female coming in with complaint of back and neck pain. OMT 06/19/2020. Patient states started experiencing L hip pain that started a week ago. Patient turned her body and felt a sharp pain in that hip and now she will have a sort of grinding feeling.  Medications patient has been prescribed: None         Past Medical History:  Diagnosis Date   Complication of anesthesia    History of vertigo    Hx of varicella    PONV (postoperative nausea and vomiting)     Allergies  Allergen Reactions   Lactose Intolerance (Gi) Other (See Comments)    Gas, stomach cramps     Review of Systems:  No headache, visual changes, nausea, vomiting, diarrhea, constipation, dizziness, abdominal pain, skin rash, fevers, chills, night sweats, weight loss, swollen lymph nodes, body aches, joint swelling, chest pain, shortness of breath, mood changes. POSITIVE muscle aches  Objective  Blood pressure 110/76, pulse 95, height 5\' 6"  (1.676 m), weight 162 lb (73.5 kg), SpO2 98 %, unknown if currently breastfeeding.   General: No apparent distress alert and oriented x3 mood and affect normal, dressed appropriately.  HEENT: Pupils equal, extraocular movements intact  Respiratory: Patient's speak in full sentences and does not appear short of breath  Cardiovascular: No lower extremity edema, non tender, no erythema  Low back exam shows tenderness over the right sacroiliac joint.  Decent range of motion of the lumbar spine noted.  Patient does have tightness with FABER test.  Negative straight leg test.  5 5 strength in lower extremities  Osteopathic findings   T9 extended rotated and  side bent left L2 flexed rotated and side bent right Sacrum right on right       Assessment and Plan:  SI (sacroiliac) joint dysfunction Discussed potential x-rays at this time.  Continue to keep patient active.  Responding well to manipulation.  No change in medications.  Follow-up again in 6 to 8 weeks   Nonallopathic problems  Decision today to treat with OMT was based on Physical Exam  After verbal consent patient was treated with HVLA, ME, FPR techniques in cervical, rib, thoracic, lumbar, and sacral  areas  Patient tolerated the procedure well with improvement in symptoms  Patient given exercises, stretches and lifestyle modifications  See medications in patient instructions if given  Patient will follow up in 4-8 weeks      The above documentation has been reviewed and is accurate and complete , DO        Note: This dictation was prepared with Dragon dictation along with smaller phrase technology. Any transcriptional errors that result from this process are unintentional.

## 2020-08-05 NOTE — Patient Instructions (Addendum)
Good to see you  Good luck with Kindergarten See me again in 6-8 weeks

## 2020-08-05 NOTE — Assessment & Plan Note (Signed)
Discussed potential x-rays at this time.  Continue to keep patient active.  Responding well to manipulation.  No change in medications.  Follow-up again in 6 to 8 weeks

## 2020-09-04 ENCOUNTER — Encounter: Payer: Self-pay | Admitting: Family Medicine

## 2020-09-04 ENCOUNTER — Other Ambulatory Visit: Payer: Self-pay

## 2020-09-04 ENCOUNTER — Ambulatory Visit: Payer: BC Managed Care – PPO | Admitting: Family Medicine

## 2020-09-04 VITALS — BP 114/70 | HR 86 | Temp 98.1°F | Ht 66.0 in | Wt 160.0 lb

## 2020-09-04 DIAGNOSIS — B351 Tinea unguium: Secondary | ICD-10-CM

## 2020-09-04 MED ORDER — TERBINAFINE HCL 250 MG PO TABS: 250.0000 mg | ORAL_TABLET | Freq: Every day | ORAL | 0 refills | Status: DC

## 2020-09-04 NOTE — Patient Instructions (Addendum)
Health Maintenance Due  Topic Date Due   PAP SMEAR-Modifier  06/09/2019   INFLUENZA VACCINE   Please consider getting your flu shot in the Fall. If you get this outside of our office, please let us know.  08/04/2020   Start Lamisil taking one tablet daily for 6 weeks. Please monitor and if you fail to improve, have new worsening symptoms, or experience any adverse side effects, please call back and let me know or you can send me a message through MyChart.  Please limit you alcohol intake to at least 1 beverage - this will benefit you in the long run in regards to the liver.   Recommended follow up: Follow-up for new or worsening symptoms or is symptoms fail to improve

## 2020-09-04 NOTE — Progress Notes (Signed)
Phone [phone] In person visit   Subjective:   Kirsten Hunt is a 35 y.o. year old very pleasant female patient who presents for/with See problem oriented charting Chief Complaint  Patient presents with   Nail Problem    Possible infection     This visit occurred during the SARS-CoV-2 public health emergency.  Safety protocols were in place, including screening questions prior to the visit, additional usage of staff PPE, and extensive cleaning of exam room while observing appropriate contact time as indicated for disinfecting solutions.   Past Medical History-  Patient Active Problem List   Diagnosis Date Noted   SI (sacroiliac) joint dysfunction 09/05/2018   Nonallopathic lesion of sacral region 09/05/2018   Nonallopathic lesion of lumbosacral region 09/05/2018    Medications- reviewed and updated Current Outpatient Medications  Medication Sig Dispense Refill   cholecalciferol (VITAMIN D3) 25 MCG (1000 UNIT) tablet Take 1,000 Units by mouth daily.     lactase (LACTAID) 3000 units tablet Take 3,000 Units by mouth daily as needed (to prevent stomach cramps prior to a meal).     norgestimate-ethinyl estradiol (ORTHO-CYCLEN) 0.25-35 MG-MCG tablet Sprintec (28) 0.25 mg-35 mcg tablet  Take 1 tablet every day by oral route.     Probiotic Product (PROBIOTIC PO) Take 1 capsule by mouth daily.     terbinafine (LAMISIL) 250 MG tablet Take 1 tablet (250 mg total) by mouth daily. 42 tablet 0   No current facility-administered medications for this visit.     Objective:  BP 114/70   Pulse 86   Temp 98.1 F (36.7 C) (Temporal)   Ht 5\' 6"  (1.676 m)   Wt 160 lb (72.6 kg)   LMP 08/21/2020   SpO2 98%   BMI 25.82 kg/m  Gen: NAD, resting comfortably CV: RRR no murmurs rubs or gallops Lungs: CTAB no crackles, wheeze, rhonchi Skin: warm, dry, 3 fingernails with thickened yellow appearance more towards brace- at distal portion appear more thin    Assessment and Plan  #Fungal  Infection of Finger Nails S:for over a year has noted yellowing and pulling up of several fingernails - both hands involved on the right hand is 2nd and 4th finger and on the left hand is 4th finger only. No pain no discharge. No trauma. Did not want to treat previously while pregnant- now youngest is 7 months - not breastfeeding- she is ready for treatment  A/P: Onychomycosis noted on 3 fingernails-discussed possible topical treatment but not as effective-she would prefer oral treatment with terbinafine for 6 weeks.  She agrees to follow-up with 08/23/2020 if has any adverse side effects or symptoms fail to improve.  For liver health recommended limiting alcohol to 1 beverage per night- particularly with lamisl- discussed hepatic risks  Recommended follow up: new or worsening symptoms or failure to improve Future Appointments  Date Time Provider Department Center  09/16/2020  9:00 AM 09/18/2020, DO [facility] None  11/04/2020 10:00 AM 13/01/2020, MD [facility]   Lab/Order associations:   ICD-10-CM   1. Fungal nail infection  B35.1      Meds ordered this encounter  Medications   terbinafine (LAMISIL) 250 MG tablet    Sig: Take 1 tablet (250 mg total) by mouth daily.    Dispense:  42 tablet    Refill:  0   I,Harris Phan,acting as a scribe for Ardith Dark, MD.,have documented all relevant documentation on the behalf of Tana Conch, MD,as directed by  Tana Conch, MD while  in the presence of Tana Conch, MD.  I, Tana Conch, MD, have reviewed all documentation for this visit. The documentation on 09/04/20 for the exam, diagnosis, procedures, and orders are all accurate and complete.  Return precautions advised.  Tana Conch, MD

## 2020-09-15 NOTE — Progress Notes (Signed)
  [name] [address] [place] [address] Phone: [phone] Subjective:   Bruce Donath, am serving as a scribe for Dr. Antoine Primas.  This visit occurred during the SARS-CoV-2 public health emergency.  Safety protocols were in place, including screening questions prior to the visit, additional usage of staff PPE, and extensive cleaning of exam room while observing appropriate contact time as indicated for disinfecting solutions.   I'm seeing this patient by the request  of:  Patient, No Pcp Per (Inactive)  CC: neck and back pain   ACZ:[id number]  Jadalee Westcott is a 35 y.o. female coming in with complaint of back and neck pain. OMT 08/05/2020. Patient states that she has not had any issues since last visit. Overall doing well still has 36 year old and [place] at home. Feeling better overall and increasing activity.  Has started a job as well part time   Medications patient has been prescribed: None           Past Medical History:  Diagnosis Date   Complication of anesthesia    History of vertigo    Hx of varicella    PONV (postoperative nausea and vomiting)     Allergies  Allergen Reactions   Lactose Intolerance (Gi) Other (See Comments)    Gas, stomach cramps     Review of Systems:  No headache, visual changes, nausea, vomiting, diarrhea, constipation, dizziness, abdominal pain, skin rash, fevers, chills, night sweats, weight loss, swollen lymph nodes, body aches, joint swelling, chest pain, shortness of breath, mood changes. POSITIVE muscle aches  Objective  Blood pressure 106/72, pulse 66, height 5\' 6"  (1.676 m), weight 161 lb (73 kg), last menstrual period 08/21/2020, SpO2 99 %, unknown if currently breastfeeding.   General: No apparent distress alert and oriented x3 mood and affect normal, dressed appropriately.  HEENT: Pupils equal, extraocular movements intact  Respiratory: Patient's speak in full sentences and does not  appear short of breath  Cardiovascular: No lower extremity edema, non tender, no erythema  Low back exam does have tightness, more pain over the SI joint negative straight leg test.  Patient does have some tightness with FABER test.  Some mild tightness in the upper back as well noted.   Osteopathic findings   T9 extended rotated and side bent left L2 flexed rotated and side bent right Sacrum right on right       Assessment and Plan:  SI (sacroiliac) joint dysfunction Chronic problem, but stable. Discussed HEP Discussed which activities to do and which ones to avoid. Discussed which activities to do and which ones to avoid RTC in 6-8 weeks    Nonallopathic problems  Decision today to treat with OMT was based on Physical Exam  After verbal consent patient was treated with HVLA, ME, FPR techniques in thoracic, lumbar, and sacral  areas  Patient tolerated the procedure well with improvement in symptoms  Patient given exercises, stretches and lifestyle modifications  See medications in patient instructions if given  Patient will follow up in 4-8 weeks      The above documentation has been reviewed and is accurate and complete 08/23/2020, DO        Note: This dictation was prepared with Dragon dictation along with smaller phrase technology. Any transcriptional errors that result from this process are unintentional.

## 2020-09-16 ENCOUNTER — Encounter: Payer: Self-pay | Admitting: Family Medicine

## 2020-09-16 ENCOUNTER — Other Ambulatory Visit: Payer: Self-pay

## 2020-09-16 ENCOUNTER — Ambulatory Visit (INDEPENDENT_AMBULATORY_CARE_PROVIDER_SITE_OTHER): Payer: BC Managed Care – PPO | Admitting: Family Medicine

## 2020-09-16 VITALS — BP 106/72 | HR 66 | Ht 66.0 in | Wt 161.0 lb

## 2020-09-16 DIAGNOSIS — M533 Sacrococcygeal disorders, not elsewhere classified: Secondary | ICD-10-CM | POA: Diagnosis not present

## 2020-09-16 DIAGNOSIS — M9902 Segmental and somatic dysfunction of thoracic region: Secondary | ICD-10-CM | POA: Diagnosis not present

## 2020-09-16 DIAGNOSIS — M9903 Segmental and somatic dysfunction of lumbar region: Secondary | ICD-10-CM

## 2020-09-16 DIAGNOSIS — M9904 Segmental and somatic dysfunction of sacral region: Secondary | ICD-10-CM | POA: Diagnosis not present

## 2020-09-16 NOTE — Assessment & Plan Note (Signed)
Chronic problem, but stable. Discussed HEP Discussed which activities to do and which ones to avoid. Discussed which activities to do and which ones to avoid RTC in 6-8 weeks

## 2020-10-03 ENCOUNTER — Other Ambulatory Visit: Payer: Self-pay | Admitting: Family Medicine

## 2020-10-06 ENCOUNTER — Telehealth: Payer: Self-pay

## 2020-10-06 NOTE — Telephone Encounter (Signed)
LAST APPOINTMENT DATE:  09/04/20  NEXT APPOINTMENT DATE: 12/01/20  MEDICATION:terbinafine (LAMISIL) 250 MG tablet  PHARMACY:[facility] [id number] - [place], [place] - [address]

## 2020-10-06 NOTE — Telephone Encounter (Signed)
Looks like Dr Durene Cal sent this in a month ago and recommended she follow up if not improving.  Kirsten Hunt. [name], MD 10/06/2020 10:34 AM

## 2020-10-06 NOTE — Telephone Encounter (Signed)
See below, please schedule pt f/u regarding this refill.

## 2020-10-06 NOTE — Telephone Encounter (Signed)
Noted  

## 2020-10-06 NOTE — Telephone Encounter (Signed)
Called and talked to pt. She stated that the pharmacy made a mistake and she was able to get 12 extra pills. She stated that she is not doing better after taking the last 12 pills, she will call and set up an appt.

## 2020-10-06 NOTE — Telephone Encounter (Signed)
Ok to refill 

## 2020-10-31 ENCOUNTER — Other Ambulatory Visit: Payer: Self-pay

## 2020-10-31 ENCOUNTER — Encounter: Payer: Self-pay | Admitting: Family Medicine

## 2020-10-31 ENCOUNTER — Ambulatory Visit: Payer: BC Managed Care – PPO | Admitting: Family Medicine

## 2020-10-31 VITALS — BP 120/82 | HR 88 | Temp 98.3°F | Ht 66.0 in | Wt 154.8 lb

## 2020-10-31 DIAGNOSIS — B351 Tinea unguium: Secondary | ICD-10-CM

## 2020-10-31 DIAGNOSIS — Z23 Encounter for immunization: Secondary | ICD-10-CM

## 2020-10-31 DIAGNOSIS — J329 Chronic sinusitis, unspecified: Secondary | ICD-10-CM

## 2020-10-31 MED ORDER — ITRACONAZOLE 100 MG PO CAPS: 200.0000 mg | ORAL_CAPSULE | Freq: Every day | ORAL | 0 refills | Status: AC

## 2020-10-31 MED ORDER — AZELASTINE HCL 0.1 % NA SOLN: 2.0000 | Freq: Two times a day (BID) | NASAL | 12 refills | Status: DC

## 2020-10-31 MED ORDER — AZITHROMYCIN 250 MG PO TABS: ORAL_TABLET | ORAL | 0 refills | Status: DC

## 2020-10-31 NOTE — Patient Instructions (Signed)
It was very nice to see you today!  We will start the Astelin.  Start the antibiotic if your symptoms are not improving in the next few days.  We will send in itraconazole for your fingernails.  Let us know if not improving or follow-up with your dermatologist.  Please let me know if this is very expensive.  We will see you back soon for your annual physical.  Come back sooner if needed.  Take care, Dr [name]  PLEASE NOTE:  If you had any lab tests please let us know if you have not heard back within a few days. You may see your results on mychart before we have a chance to review them but we will give you a call once they are reviewed by [place]. If we ordered any referrals today, please let us know if you have not heard from their office within the next week.   Please try these tips to maintain a healthy lifestyle:  Eat at least 3 REAL meals and 1-2 snacks per day.  Aim for no more than 5 hours between eating.  If you eat breakfast, please do so within one hour of getting up.   Each meal should contain half fruits/vegetables, one quarter protein, and one quarter carbs (no bigger than a computer mouse)  Cut down on sweet beverages. This includes juice, soda, and sweet tea.   Drink at least 1 glass of water with each meal and aim for at least 8 glasses per day  Exercise at least 150 minutes every week.

## 2020-10-31 NOTE — Progress Notes (Signed)
   Kirsten Hunt is a 35 y.o. female who presents today for an office visit.  Assessment/Plan:  Sinusitis Start Astelin nasal spray.  We will give pocket prescription for azithromycin with instruction to not start unless symptoms fail to improve over the next few days.  Can continue over-the-counter meds  Onychomycosis Did not respond to terbinafine.  We will try course of itraconazole.  She will need to follow-up with dermatology if this still is an ongoing issue.    Subjective:  HPI:  Patient here for onychomycosis follow-up.  Saw a different provider 2 months ago.  Started terbinafine.  Has not noticed any improvement.  Additionally she has concerns about sinus infection. This first started around a week ago. It started with chills and fever which resolved after 24 hours, but the sinus congestion has been worsening. She admits to chest congestion and coughing up mucus.        Objective:  Physical Exam: BP 120/82   Pulse 88   Temp 98.3 F (36.8 C) (Temporal)   Ht 5\' 6"  (1.676 m)   Wt 154 lb 12.8 oz (70.2 kg)   LMP 10/10/2020   SpO2 99%   BMI 24.99 kg/m   Gen: No acute distress, resting comfortably HEENT: TMs clear.  OP erythematous.  Nose mucosa erythematous and boggy. CV: Regular rate and rhythm with no murmurs appreciated Pulm: Normal work of breathing, clear to auscultation bilaterally with no crackles, wheezes, or rhonchi Neuro: Grossly normal, moves all extremities Psych: Normal affect and thought content      I,Kirsten Hunt,acting as a scribe for 12/10/2020, MD.,have documented all relevant documentation on the behalf of Kirsten Doe, MD,as directed by  Kirsten Doe, MD while in the presence of Kirsten Doe, MD.  I, Kirsten Doe, MD, have reviewed all documentation for this visit. The documentation on 10/31/20 for the exam, diagnosis, procedures, and orders are all accurate and complete.  11/02/20. Kirsten Degree, MD 10/31/2020 11:42 AM

## 2020-11-04 ENCOUNTER — Encounter: Payer: BC Managed Care – PPO | Admitting: Family Medicine

## 2020-11-05 ENCOUNTER — Ambulatory Visit: Payer: BC Managed Care – PPO | Admitting: Family Medicine

## 2020-12-01 ENCOUNTER — Encounter: Payer: Self-pay | Admitting: Family Medicine

## 2020-12-01 ENCOUNTER — Ambulatory Visit: Payer: BC Managed Care – PPO | Admitting: Family Medicine

## 2020-12-01 ENCOUNTER — Other Ambulatory Visit: Payer: Self-pay

## 2020-12-01 VITALS — Temp 98.0°F | Ht 66.0 in | Wt 153.0 lb

## 2020-12-01 DIAGNOSIS — B351 Tinea unguium: Secondary | ICD-10-CM

## 2020-12-01 DIAGNOSIS — E785 Hyperlipidemia, unspecified: Secondary | ICD-10-CM | POA: Diagnosis not present

## 2020-12-01 NOTE — Progress Notes (Signed)
   Kirsten Hunt is a 35 y.o. female who presents today for an office visit.  Assessment/Plan:  New/Acute Problems: Onychomycosis Has not had much improvement with terbinafine or itraconazole.  She will follow-up with dermatology. Will place referral today.  Chronic Problems Addressed Today: Dyslipidemia Continue lifestyle modifications.  We can recheck labs next year.    Subjective:  HPI:  Patient here to transfer care.  Previous PCP no longer works at this office.  She has done well.  She tries to stay active.  She was recently treated with a course of terbinafine and then itraconazole for onychomycosis.  She has not noticed significant change in symptoms over the last several weeks.        Objective:  Physical Exam: Temp 98 F (36.7 C) (Temporal)   Ht 5\' 6"  (1.676 m)   Wt 153 lb (69.4 kg)   LMP 11/17/2020   BMI 24.69 kg/m   Gen: No acute distress, resting comfortably CV: Regular rate and rhythm with no murmurs appreciated Pulm: Normal work of breathing, clear to auscultation bilaterally with no crackles, wheezes, or rhonchi MSK: Dystrophic second right finger nail Neuro: Grossly normal, moves all extremities Psych: Normal affect and thought content      Faiz Weber M. 11/19/2020, MD 12/01/2020 2:02 PM

## 2020-12-01 NOTE — Assessment & Plan Note (Signed)
Continue lifestyle modifications.  We can recheck labs next year.

## 2020-12-09 DIAGNOSIS — B351 Tinea unguium: Secondary | ICD-10-CM | POA: Diagnosis not present

## 2020-12-17 NOTE — Progress Notes (Signed)
[name] [address] [place] [address] Phone: [phone] Subjective:   Bruce Donath, am serving as a scribe for Dr. Antoine Primas.  This visit occurred during the SARS-CoV-2 public health emergency.  Safety protocols were in place, including screening questions prior to the visit, additional usage of staff PPE, and extensive cleaning of exam room while observing appropriate contact time as indicated for disinfecting solutions.    I'm seeing this patient by the request  of:  Ardith Dark, MD  CC: back and neck pain   ONG:[id number]  Kirsten Hunt is a 35 y.o. female coming in with complaint of back and neck pain. OMT 09/16/2020. Patient states that she has been doing well. No issues since last visit.   Medications patient has been prescribed: None       Reviewed prior external information including notes and imaging from previsou exam, outside providers and external EMR if available.   As well as notes that were available from care everywhere and other healthcare systems.  Past medical history, social, surgical and family history all reviewed in electronic medical record.  No pertanent information unless stated regarding to the chief complaint.   Past Medical History:  Diagnosis Date   Complication of anesthesia    History of vertigo    Hx of varicella    PONV (postoperative nausea and vomiting)     Allergies  Allergen Reactions   Lactose Intolerance (Gi) Other (See Comments)    Gas, stomach cramps     Review of Systems:  No headache, visual changes, nausea, vomiting, diarrhea, constipation, dizziness, abdominal pain, skin rash, fevers, chills, night sweats, weight loss, swollen lymph nodes, body aches, joint swelling, chest pain, shortness of breath, mood changes. POSITIVE muscle aches  Objective  Blood pressure 124/84, pulse 78, height 5\' 6"  (1.676 m), weight 155 lb (70.3 kg), SpO2 99 %, unknown if currently  breastfeeding.   General: No apparent distress alert and oriented x3 mood and affect normal, dressed appropriately.  HEENT: Pupils equal, extraocular movements intact  Respiratory: Patient's speak in full sentences and does not appear short of breath  Cardiovascular: No lower extremity edema, non tender, no erythema  Back exam shows the patient does have improvement in the strength of the core.  Patient has improvement in the hip abductor strength.  Negative straight leg test.  Osteopathic findings  T9 extended rotated and side bent left L2 flexed rotated and side bent right Sacrum right on right       Assessment and Plan:  SI (sacroiliac) joint dysfunction Chronic problem and stable.  Patient has been relatively active.  Patient has been running more and does look like she is doing really well.  Discussed posture and ergonomics.  Patient is still caring for her young children but is doing really well.  Follow-up with me again in 6 to 8 weeks   Nonallopathic problems  Decision today to treat with OMT was based on Physical Exam  After verbal consent patient was treated with HVLA, ME, FPR techniques in  thoracic, lumbar, and sacral  areas  Patient tolerated the procedure well with improvement in symptoms  Patient given exercises, stretches and lifestyle modifications  See medications in patient instructions if given  Patient will follow up in 4-8 weeks      The above documentation has been reviewed and is accurate and complete , DO        Note: This dictation was prepared with  Dragon dictation along with smaller Lobbyist. Any transcriptional errors that result from this process are unintentional.

## 2020-12-18 ENCOUNTER — Encounter: Payer: Self-pay | Admitting: Family Medicine

## 2020-12-18 ENCOUNTER — Ambulatory Visit (INDEPENDENT_AMBULATORY_CARE_PROVIDER_SITE_OTHER): Payer: BC Managed Care – PPO | Admitting: Family Medicine

## 2020-12-18 ENCOUNTER — Other Ambulatory Visit: Payer: Self-pay

## 2020-12-18 VITALS — BP 124/84 | HR 78 | Ht 66.0 in | Wt 155.0 lb

## 2020-12-18 DIAGNOSIS — M9903 Segmental and somatic dysfunction of lumbar region: Secondary | ICD-10-CM | POA: Diagnosis not present

## 2020-12-18 DIAGNOSIS — M9902 Segmental and somatic dysfunction of thoracic region: Secondary | ICD-10-CM | POA: Diagnosis not present

## 2020-12-18 DIAGNOSIS — M9904 Segmental and somatic dysfunction of sacral region: Secondary | ICD-10-CM

## 2020-12-18 DIAGNOSIS — M533 Sacrococcygeal disorders, not elsewhere classified: Secondary | ICD-10-CM | POA: Diagnosis not present

## 2020-12-18 NOTE — Assessment & Plan Note (Signed)
Chronic problem and stable.  Patient has been relatively active.  Patient has been running more and does look like she is doing really well.  Discussed posture and ergonomics.  Patient is still caring for her young children but is doing really well.  Follow-up with me again in 6 to 8 weeks

## 2021-01-01 DIAGNOSIS — Z3189 Encounter for other procreative management: Secondary | ICD-10-CM | POA: Diagnosis not present

## 2021-03-03 NOTE — Progress Notes (Signed)
?Charlann Boxer D.O. ?[facility] ?[place] ?Phone: [phone] ?Subjective:   ?I, Kirsten Hunt, am serving as a scribe for Dr. Hulan Saas. ? ?This visit occurred during the SARS-CoV-2 public health emergency.  Safety protocols were in place, including screening questions prior to the visit, additional usage of staff PPE, and extensive cleaning of exam room while observing appropriate contact time as indicated for disinfecting solutions.  ? ? ?I'm seeing this patient by the request  of:  Vivi Barrack, MD ? ?CC: Back and neck pain follow-up ? ?[id number]  ?Kirsten Hunt is a 36 y.o. female coming in with complaint of back and neck pain. OMT 12/18/2020. Patient states that she has been doing well since last visit.  ? ?Medications patient has been prescribed: None ? ?Taking: ? ? ?  ? ? ? ? ?Reviewed prior external information including notes and imaging from previsou exam, outside providers and external EMR if available.  ? ?As well as notes that were available from care everywhere and other healthcare systems. ? ?Past medical history, social, surgical and family history all reviewed in electronic medical record.  No pertanent information unless stated regarding to the chief complaint.  ? ?Past Medical History:  ?Diagnosis Date  ? Complication of anesthesia   ? History of vertigo   ? Hx of varicella   ? PONV (postoperative nausea and vomiting)   ?  ?Allergies  ?Allergen Reactions  ? Lactose Intolerance (Gi) Other (See Comments)  ?  Gas, stomach cramps  ? ? ? ?Review of Systems: ? No headache, visual changes, nausea, vomiting, diarrhea, constipation, dizziness, abdominal pain, skin rash, fevers, chills, night sweats, weight loss, swollen lymph nodes, body aches, joint swelling, chest pain, shortness of breath, mood changes. POSITIVE muscle aches ? ?Objective  ?Blood pressure 110/68, pulse 73, height 5\' 6"  (1.676 m), weight 150 lb (68 kg), SpO2 98 %, unknown if currently  breastfeeding. ?  ?General: No apparent distress alert and oriented x3 mood and affect normal, dressed appropriately.  ?HEENT: Pupils equal, extraocular movements intact  ?Respiratory: Patient's speak in full sentences and does not appear short of breath  ?Cardiovascular: No lower extremity edema, non tender, no erythema  ?Back exam does show the patient does have some very mild loss of lordosis but improvement in core strength noted today.  Patient does have some tenderness to palpation over the sacroiliac joints left greater than right.  Patient does have tightness noted also in the thoracolumbar juncture on the left side greater than the right. ? ?Osteopathic findings ? ?C6 flexed rotated and side bent left ?T5 extended rotated and side bent right inhaled rib ?T11 extended rotated and side bent left ?L2 flexed rotated and side bent right ?Sacrum left on left ? ? ? ? ?  ?Assessment and Plan: ? ?SI (sacroiliac) joint dysfunction ?Improvement noted at this time.  Some increased tone in upper back and mid back secondary to patient likely holding her son on a more regular basis.  We discussed different posture and ergonomic changes but I do think patient is doing well.  Follow-up with me again in 6 to 8 weeks otherwise.  ? ?Nonallopathic problems ? ?Decision today to treat with OMT was based on Physical Exam ? ?After verbal consent patient was treated with HVLA, ME, FPR techniques in cervical, rib, thoracic, lumbar, and sacral  areas ? ?Patient tolerated the procedure well with improvement in symptoms ? ?Patient given exercises, stretches and lifestyle modifications ? ?See  medications in patient instructions if given ? ?Patient will follow up in 6-8 weeks ? ?  ? ? ?The above documentation has been reviewed and is accurate and complete Kirsten Pulley, DO ? ? ? ?  ? ? Note: This dictation was prepared with Dragon dictation along with smaller phrase technology. Any transcriptional errors that result from this process  are unintentional.    ?  ?  ? ?

## 2021-03-04 ENCOUNTER — Ambulatory Visit (INDEPENDENT_AMBULATORY_CARE_PROVIDER_SITE_OTHER): Payer: BC Managed Care – PPO | Admitting: Family Medicine

## 2021-03-04 ENCOUNTER — Encounter: Payer: Self-pay | Admitting: Family Medicine

## 2021-03-04 ENCOUNTER — Other Ambulatory Visit: Payer: Self-pay

## 2021-03-04 VITALS — BP 110/68 | HR 73 | Ht 66.0 in | Wt 150.0 lb

## 2021-03-04 DIAGNOSIS — M9904 Segmental and somatic dysfunction of sacral region: Secondary | ICD-10-CM | POA: Diagnosis not present

## 2021-03-04 DIAGNOSIS — M9901 Segmental and somatic dysfunction of cervical region: Secondary | ICD-10-CM | POA: Diagnosis not present

## 2021-03-04 DIAGNOSIS — M9902 Segmental and somatic dysfunction of thoracic region: Secondary | ICD-10-CM | POA: Diagnosis not present

## 2021-03-04 DIAGNOSIS — M9908 Segmental and somatic dysfunction of rib cage: Secondary | ICD-10-CM

## 2021-03-04 DIAGNOSIS — M533 Sacrococcygeal disorders, not elsewhere classified: Secondary | ICD-10-CM

## 2021-03-04 DIAGNOSIS — M9903 Segmental and somatic dysfunction of lumbar region: Secondary | ICD-10-CM

## 2021-03-04 NOTE — Assessment & Plan Note (Signed)
Improvement noted at this time.  Some increased tone in upper back and mid back secondary to patient likely holding her son on a more regular basis.  We discussed different posture and ergonomic changes but I do think patient is doing well.  Follow-up with me again in 6 to 8 weeks otherwise. ?

## 2021-03-04 NOTE — Patient Instructions (Signed)
Good to see you ?See me in  ?

## 2021-04-28 NOTE — Progress Notes (Signed)
?Charlann Boxer D.O. ?[facility] ?[place] ?Phone: [phone] ?Subjective:   ?I, Kirsten Hunt, am serving as a scribe for Dr. Hulan Saas. ? ?This visit occurred during the SARS-CoV-2 public health emergency.  Safety protocols were in place, including screening questions prior to the visit, additional usage of staff PPE, and extensive cleaning of exam room while observing appropriate contact time as indicated for disinfecting solutions.  ? ? ?I'm seeing this patient by the request  of:  Vivi Barrack, MD ? ?CC: Neck and back pain ? ?[id number]  ?Kirsten Hunt is a 36 y.o. female coming in with complaint of back and neck pain. OMT 03/04/2021. Patient states overall doing relatively well.  Just states that it is mostly low back.  Very minimal overall.  Still has pain daily but does feel like he has made improvement.  Continues to be more active ? ?Medications patient has been prescribed: None ? ?Taking: ? ? ?  ? ? ? ? ?Reviewed prior external information including notes and imaging from previsou exam, outside providers and external EMR if available.  ? ?As well as notes that were available from care everywhere and other healthcare systems. ? ?Past medical history, social, surgical and family history all reviewed in electronic medical record.  No pertanent information unless stated regarding to the chief complaint.  ? ?Past Medical History:  ?Diagnosis Date  ? Complication of anesthesia   ? History of vertigo   ? Hx of varicella   ? PONV (postoperative nausea and vomiting)   ?  ?Allergies  ?Allergen Reactions  ? Lactose Intolerance (Gi) Other (See Comments)  ?  Gas, stomach cramps  ? ? ? ?Review of Systems: ? No headache, visual changes, nausea, vomiting, diarrhea, constipation, dizziness, abdominal pain, skin rash, fevers, chills, night sweats, weight loss, swollen lymph nodes, body aches, joint swelling, chest pain, shortness of breath, mood changes. POSITIVE muscle  aches ? ?Objective  ?Blood pressure 110/76, pulse 80, height 5\' 6"  (1.676 m), weight 151 lb (68.5 kg), SpO2 98 %, unknown if currently breastfeeding. ?  ?General: No apparent distress alert and oriented x3 mood and affect normal, dressed appropriately.  ?HEENT: Pupils equal, extraocular movements intact  ?Respiratory: Patient's speak in full sentences and does not appear short of breath  ?Cardiovascular: No lower extremity edema, non tender, no erythema  ?Improvement in core strength noted.  Patient does have tenderness over the right sacroiliac joint still.  Patient does have tightness with FABER test and negative straight leg test. ? ?Osteopathic findings ? ?T8 extended rotated and side bent left ?L1 flexed rotated and side bent right ?Sacrum right on right ? ? ? ? ?  ?Assessment and Plan: ? ?SI (sacroiliac) joint dysfunction ?Patient is doing significantly better at this time.  Do expect patient to continue to improve.  Can decrease frequency of office visits to every 3 to 4 months.  Patient is in agreement with the plan and will come back at that time.  Any worsening pain call us sooner.  ? ?Nonallopathic problems ? ?Decision today to treat with OMT was based on Physical Exam ? ?After verbal consent patient was treated with HVLA, ME, FPR techniques in  thoracic, lumbar, and sacral  areas ? ?Patient tolerated the procedure well with improvement in symptoms ? ?Patient given exercises, stretches and lifestyle modifications ? ?See medications in patient instructions if given ? ?Patient will follow up in 12 weeks ? ?  ? ? ?The above documentation  has been reviewed and is accurate and complete Lyndal Pulley, DO ? ? ? ?  ? ? Note: This dictation was prepared with Dragon dictation along with smaller phrase technology. Any transcriptional errors that result from this process are unintentional.    ?  ?  ? ?

## 2021-04-29 ENCOUNTER — Ambulatory Visit (INDEPENDENT_AMBULATORY_CARE_PROVIDER_SITE_OTHER): Payer: BC Managed Care – PPO | Admitting: Family Medicine

## 2021-04-29 ENCOUNTER — Encounter: Payer: Self-pay | Admitting: Family Medicine

## 2021-04-29 VITALS — BP 110/76 | HR 80 | Ht 66.0 in | Wt 151.0 lb

## 2021-04-29 DIAGNOSIS — M9902 Segmental and somatic dysfunction of thoracic region: Secondary | ICD-10-CM

## 2021-04-29 DIAGNOSIS — M9904 Segmental and somatic dysfunction of sacral region: Secondary | ICD-10-CM

## 2021-04-29 DIAGNOSIS — M9903 Segmental and somatic dysfunction of lumbar region: Secondary | ICD-10-CM

## 2021-04-29 DIAGNOSIS — M533 Sacrococcygeal disorders, not elsewhere classified: Secondary | ICD-10-CM

## 2021-04-29 NOTE — Assessment & Plan Note (Signed)
Patient is doing significantly better at this time.  Do expect patient to continue to improve.  Can decrease frequency of office visits to every 3 to 4 months.  Patient is in agreement with the plan and will come back at that time.  Any worsening pain call us sooner. ?

## 2021-05-04 DIAGNOSIS — Z01419 Encounter for gynecological examination (general) (routine) without abnormal findings: Secondary | ICD-10-CM | POA: Diagnosis not present

## 2021-05-04 DIAGNOSIS — Z6823 Body mass index (BMI) 23.0-23.9, adult: Secondary | ICD-10-CM | POA: Diagnosis not present

## 2021-05-04 DIAGNOSIS — Z124 Encounter for screening for malignant neoplasm of cervix: Secondary | ICD-10-CM | POA: Diagnosis not present

## 2021-05-04 DIAGNOSIS — Z1151 Encounter for screening for human papillomavirus (HPV): Secondary | ICD-10-CM | POA: Diagnosis not present

## 2021-07-20 NOTE — Progress Notes (Unsigned)
[name] [address] [place] [address] Phone: [phone] Subjective:   INadine Counts, am serving as a scribe for Dr. Antoine Primas.  I'm seeing this patient by the request  of:  Ardith Dark, MD  CC: Low back pain follow-up  [id number]  Kirsten Hunt is a 36 y.o. female coming in with complaint of back and neck pain. OMT 04/29/2021. Patient states same per usual. No new complaints  Medications patient has been prescribed: None  Taking:         Reviewed prior external information including notes and imaging from previsou exam, outside providers and external EMR if available.   As well as notes that were available from care everywhere and other healthcare systems.  Past medical history, social, surgical and family history all reviewed in electronic medical record.  No pertanent information unless stated regarding to the chief complaint.   Past Medical History:  Diagnosis Date   Complication of anesthesia    History of vertigo    Hx of varicella    PONV (postoperative nausea and vomiting)     Allergies  Allergen Reactions   Lactose Intolerance (Gi) Other (See Comments)    Gas, stomach cramps     Review of Systems:  No headache, visual changes, nausea, vomiting, diarrhea, constipation, dizziness, abdominal pain, skin rash, fevers, chills, night sweats, weight loss, swollen lymph nodes, body aches, joint swelling, chest pain, shortness of breath, mood changes. POSITIVE muscle aches  Objective  Blood pressure 116/80, pulse 82, height 5\' 6"  (1.676 m), weight 149 lb (67.6 kg), SpO2 99 %, unknown if currently breastfeeding.   General: No apparent distress alert and oriented x3 mood and affect normal, dressed appropriately.  HEENT: Pupils equal, extraocular movements intact  Respiratory: Patient's speak in full sentences and does not appear short of breath  Cardiovascular: No lower extremity edema, non tender, no erythema   Gait MSK:  Back low back still has tightness noted right greater than left.  Seems to be significantly tighter of the right hip flexor than previous exam.  Negative straight leg test.  Still some more discomfort though with palpation over the sacroiliac joint and mild positive tightness with compared to the contralateral side  Osteopathic findings  C2 flexed rotated and side bent right C6 flexed rotated and side bent left T3 extended rotated and side bent right inhaled rib T9 extended rotated and side bent left L2 flexed rotated and side bent right Sacrum right on right       Assessment and Plan:  SI (sacroiliac) joint dysfunction Continues to have some chronic instability of the sacroiliac joint.  We discussed posture and ergonomics, we discussed home exercises.  Discussed core strengthening.  We discussed stretching the hip flexors with patient now starting to train for a marathon.  Discussed icing regimen and home exercises.  Follow-up again in 6 to 8 weeks otherwise.    Nonallopathic problems  Decision today to treat with OMT was based on Physical Exam  After verbal consent patient was treated with HVLA, ME, FPR techniques in cervical, rib, thoracic, lumbar, and sacral  areas  Patient tolerated the procedure well with improvement in symptoms  Patient given exercises, stretches and lifestyle modifications  See medications in patient instructions if given  Patient will follow up in 4-8 weeks    The above documentation has been reviewed and is accurate and complete Pearlean Brownie, DO  Note: This dictation was prepared with Dragon dictation along with smaller phrase technology. Any transcriptional errors that result from this process are unintentional.

## 2021-07-22 ENCOUNTER — Ambulatory Visit (INDEPENDENT_AMBULATORY_CARE_PROVIDER_SITE_OTHER): Payer: BC Managed Care – PPO | Admitting: Family Medicine

## 2021-07-22 VITALS — BP 116/80 | HR 82 | Ht 66.0 in | Wt 149.0 lb

## 2021-07-22 DIAGNOSIS — M9904 Segmental and somatic dysfunction of sacral region: Secondary | ICD-10-CM | POA: Diagnosis not present

## 2021-07-22 DIAGNOSIS — M9901 Segmental and somatic dysfunction of cervical region: Secondary | ICD-10-CM | POA: Diagnosis not present

## 2021-07-22 DIAGNOSIS — M9903 Segmental and somatic dysfunction of lumbar region: Secondary | ICD-10-CM | POA: Diagnosis not present

## 2021-07-22 DIAGNOSIS — M533 Sacrococcygeal disorders, not elsewhere classified: Secondary | ICD-10-CM | POA: Diagnosis not present

## 2021-07-22 DIAGNOSIS — M9902 Segmental and somatic dysfunction of thoracic region: Secondary | ICD-10-CM | POA: Diagnosis not present

## 2021-07-22 DIAGNOSIS — M9908 Segmental and somatic dysfunction of rib cage: Secondary | ICD-10-CM

## 2021-07-22 NOTE — Assessment & Plan Note (Signed)
Continues to have some chronic instability of the sacroiliac joint.  We discussed posture and ergonomics, we discussed home exercises.  Discussed core strengthening.  We discussed stretching the hip flexors with patient now starting to train for a marathon.  Discussed icing regimen and home exercises.  Follow-up again in 6 to 8 weeks otherwise.

## 2021-07-22 NOTE — Patient Instructions (Signed)
Good to see you! Have fun at the beach See you again in

## 2021-08-24 NOTE — Progress Notes (Unsigned)
    Kirsten Hunt D.[name] [address] [place] [address] Phone: [phone]   Assessment and Plan:     There are no diagnoses linked to this encounter.  ***   Pertinent previous records reviewed include ***   Follow Up: ***     Subjective:   I, Kirsten Hunt, am serving as a Neurosurgeon for Doctor Richardean Sale  Chief Complaint: left calf pain   HPI:   08/25/2021 Patient is a 36 year old female complaining of left calf pain. Patient states   Relevant Historical Information: ***  Additional pertinent review of systems negative.   Current Outpatient Medications:    cholecalciferol (VITAMIN D3) 25 MCG (1000 UNIT) tablet, Take 1,000 Units by mouth daily., Disp: , Rfl:    lactase (LACTAID) 3000 units tablet, Take 3,000 Units by mouth daily as needed (to prevent stomach cramps prior to a meal)., Disp: , Rfl:    norgestimate-ethinyl estradiol (ORTHO-CYCLEN) 0.25-35 MG-MCG tablet, Sprintec (28) 0.25 mg-35 mcg tablet  Take 1 tablet every day by oral route., Disp: , Rfl:    Probiotic Product (PROBIOTIC PO), Take 1 capsule by mouth daily., Disp: , Rfl:    Turmeric (QC TUMERIC COMPLEX PO), Take by mouth., Disp: , Rfl:    Objective:     There were no vitals filed for this visit.    There is no height or weight on file to calculate BMI.    Physical Exam:    ***   Electronically signed by:  Kirsten Hunt D[name] 4:07 PM 08/24/21

## 2021-08-25 ENCOUNTER — Ambulatory Visit: Payer: Self-pay

## 2021-08-25 ENCOUNTER — Ambulatory Visit (INDEPENDENT_AMBULATORY_CARE_PROVIDER_SITE_OTHER): Payer: BC Managed Care – PPO | Admitting: Sports Medicine

## 2021-08-25 VITALS — BP 120/80 | HR 76 | Ht 66.0 in | Wt 149.0 lb

## 2021-08-25 DIAGNOSIS — M79662 Pain in left lower leg: Secondary | ICD-10-CM | POA: Diagnosis not present

## 2021-08-25 MED ORDER — MELOXICAM 15 MG PO TABS: 15.0000 mg | ORAL_TABLET | Freq: Every day | ORAL | 0 refills | Status: AC

## 2021-08-25 NOTE — Patient Instructions (Addendum)
Good to see you  Start meloxicam 15 mg daily x2 weeks.  If still having pain after 2 weeks, complete 3rd-week of meloxicam. May use remaining meloxicam as needed once daily for pain control.  Do not to use additional NSAIDs while taking meloxicam.  May use Tylenol [phone] mg 2 to 3 times a day for breakthrough pain. Calf HEP  Recommend using stationary bike or elliptical to maintain cardio for next two weeks  When restarting running start at 50% then at pain free increase to 75 % and if pain free increase to 100 % Keep follow up with Dr. Katrinka Blazing

## 2021-09-07 IMAGING — US US MFM OB COMP +14 WKS
1 series · 12 of 28 positions shown · non-contrast
Comparison: none

[Series 1: us mfm ob comp +14 wks · 12 of 57 slices shown]
[im 3/57]
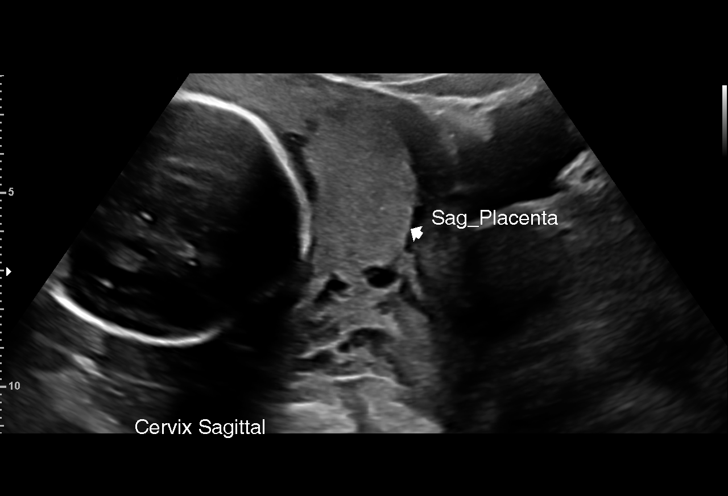
[im 7/57]
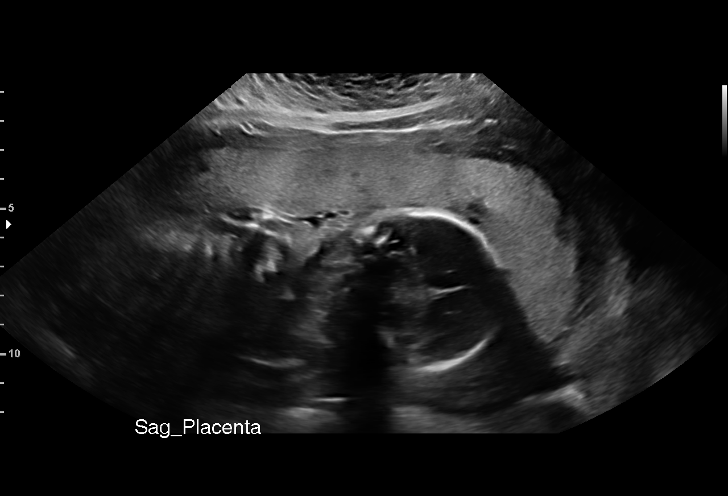
[im 11/57]
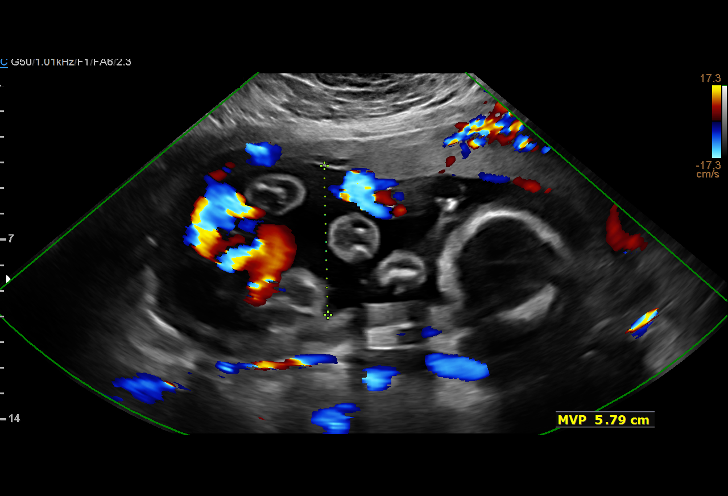
[im 17/57]
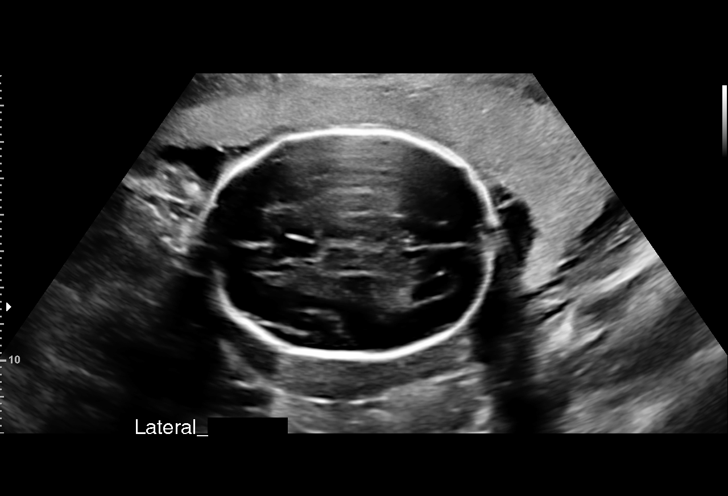
[im 21/57]
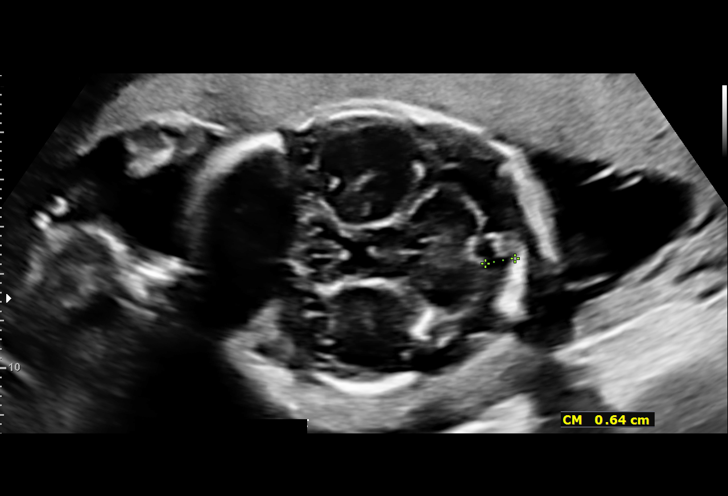
[im 25/57]
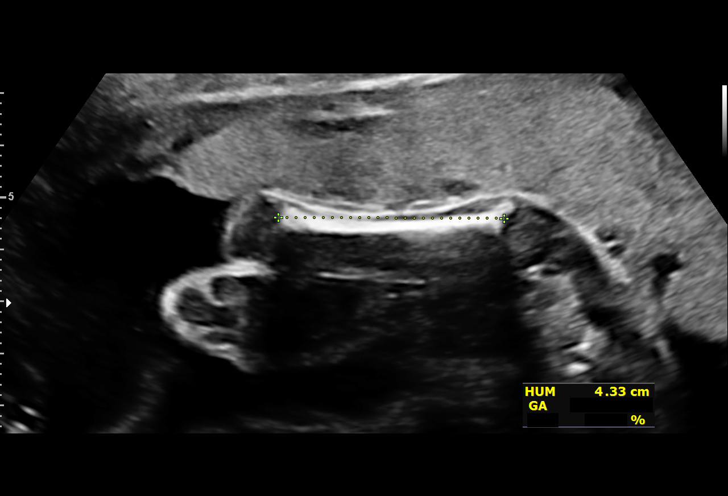
[im 32/57]
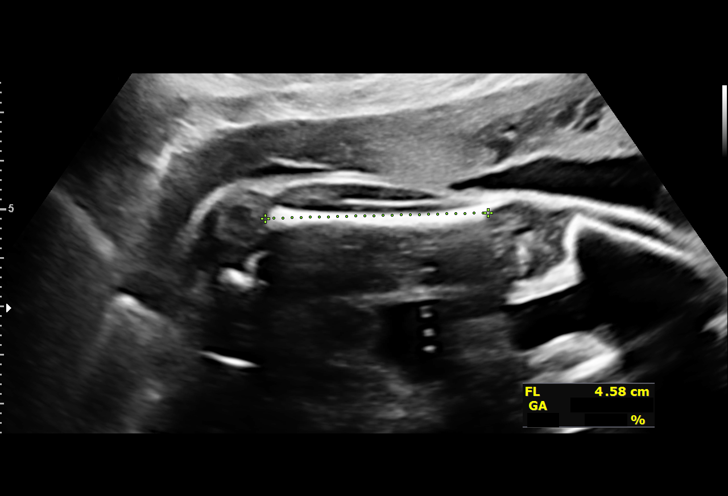
[im 36/57]
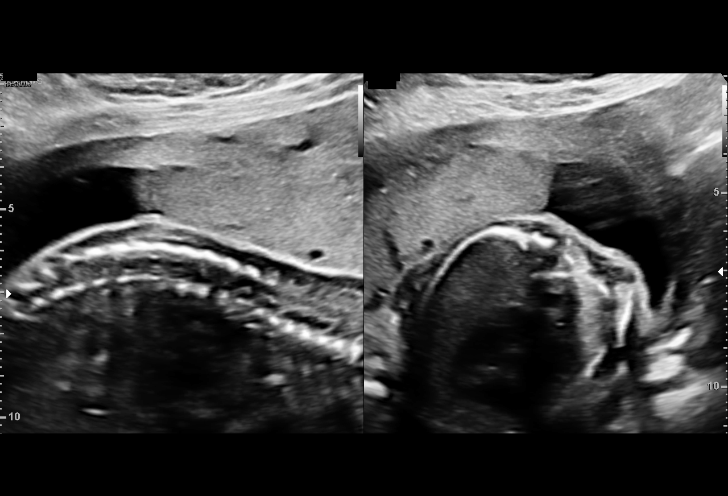
[im 40/57]
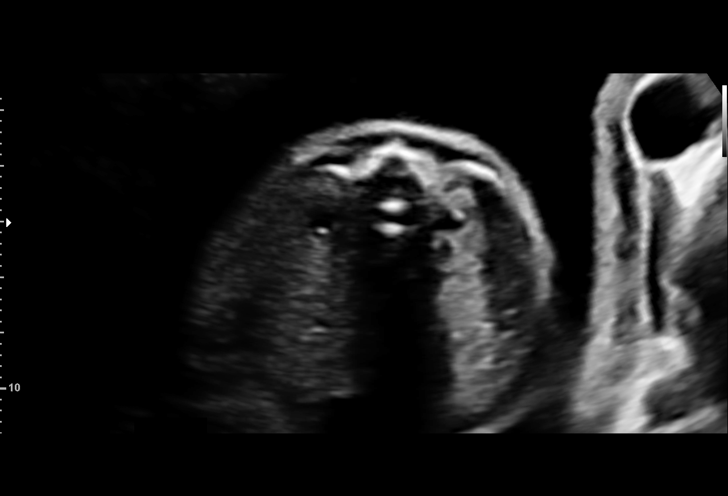
[im 46/57]
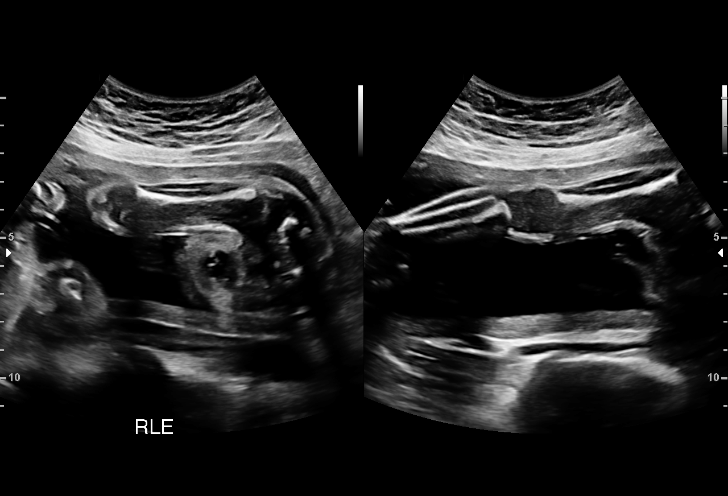
[im 50/57]
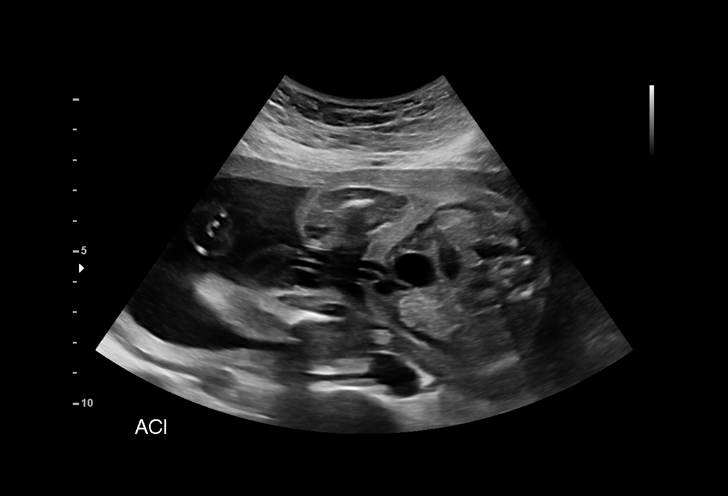
[im 54/57]
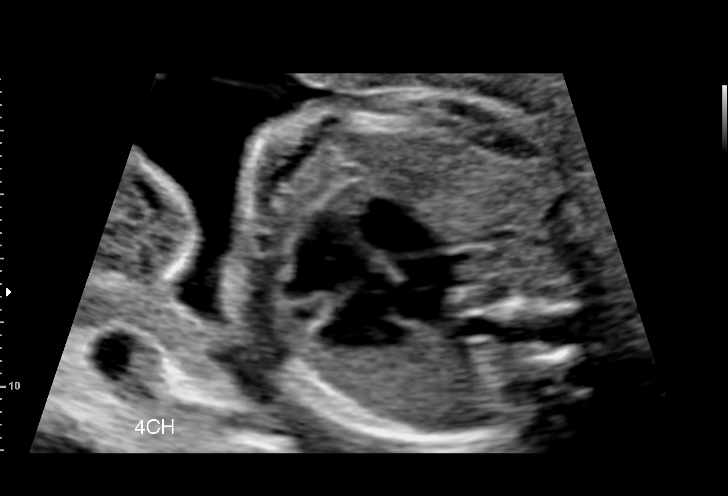

[12 of 28 positions shown; findings below may reference images not displayed]

Care

Indications

 Placenta previa specified as without
 hemorrhage, second trimester
 Vaginal bleeding in pregnancy, second
 trimester
 25 weeks gestation of pregnancy
 Encounter for antenatal screening for
 malformations
Fetal Evaluation

 Num Of Fetuses:         1
 Fetal Heart Rate(bpm):  149
 Cardiac Activity:       Observed
 Presentation:           Cephalic
 Placenta:               Anterior previa
 P. Cord Insertion:      Visualized

 Amniotic Fluid
 AFI FV:      Within normal limits

                             Largest Pocket(cm)

Biometry

 BPD:        64  mm     G. Age:  25w 6d         73  %    CI:         73.6   %    70 - 86
                                                         FL/HC:      19.2   %    18.7 -
 HC:       237   mm     G. Age:  25w 5d         56  %    HC/AC:      1.14        1.04 -
 AC:      207.9  mm     G. Age:  25w 2d         52  %    FL/BPD:     71.3   %    71 - 87
 FL:       45.6  mm     G. Age:  25w 1d         39  %    FL/AC:      21.9   %    20 - 24
 HUM:      42.6  mm     G. Age:  25w 4d         57  %
 CER:      30.5  mm     G. Age:  26w 4d         88  %
 LV:        5.3  mm
 CM:        6.4  mm

 Est. FW:     797  gm    1 lb 12 oz      55  %
Gestational Age

 Clinical EDD:  25w 0d                                        EDD:   03/07/20
 U/S Today:     25w 4d                                        EDD:   03/03/20
 Best:          25w 0d     Det. By:  Clinical EDD             EDD:   03/07/20
Anatomy

 Cranium:               Appears normal         Aortic Arch:            Appears normal
 Cavum:                 Appears normal         Ductal Arch:            Not well visualized
 Ventricles:            Appears normal         Diaphragm:              Appears normal
 Choroid Plexus:        Appears normal         Stomach:                Appears normal, left
                                                                       sided
 Cerebellum:            Appears normal         Abdomen:                Appears normal
 Posterior Fossa:       Appears normal         Abdominal Wall:         Appears nml (cord
                                                                       insert, abd wall)
 Nuchal Fold:           Not applicable (>20    Cord Vessels:           Appears normal (3
                        wks GA)                                        vessel cord)
 Face:                  Appears normal         Kidneys:                Appear normal
                        (orbits and profile)
 Lips:                  Appears normal         Bladder:                Appears normal
 Thoracic:              Appears normal         Spine:                  Limited views
                                                                       appear normal
 Heart:                 Appears normal         Upper Extremities:      Appears normal
                        (4CH, axis, and
                        situs)
 RVOT:                  Appears normal         Lower Extremities:      Appears normal
 LVOT:                  Not well visualized

 Other:  Parents do not wish to know sex of fetus. Nasal bone visualized.
         Technically difficult due to fetal position.
Cervix Uterus Adnexa

 Cervix
 Length:           3.01  cm.
Comments

 [facility] Se[facility]on is a 34-year-old gravida 3 para 2 currently at 25
 weeks and 0 days.  She was seen in consultation today due
 to placenta previa with vaginal bleeding.  The patient was
 admitted to the hospital last night after she went to the
 bathroom and had some bright red blood in the toilet.  She
 has a known placenta previa that was noted during her earlier
 ultrasounds.  This is the first time that she has experienced
 vaginal bleeding in her current pregnancy.  She denies
 feeling any abdominal pain, cramping, or contractions.  This
 is an IVF pregnancy.

 The patient reports that she had prenatal genetic screening
 as part of the IVF process that showed normal chromosomes.
 She reports that she had a normal fetal anatomy scan
 performed in your office.  Her blood type is A positive.

 Her obstetrical history includes two prior uncomplicated full-
 term vaginal deliveries.

 Other than hypothyroidism, she denies any other significant
 past medical or surgical history.

 Due to vaginal bleeding with placenta previa, she was
 admitted to the hospital and given a course of antenatal
 corticosteroids.  Today, the patient reports that her vaginal
 bleeding has resolved.  She reports feeling positive fetal
 movements.

 An ultrasound performed today shows that the fetus is in the
 vertex presentation.  The overall EFW measured 1 pound 12
 ounces (55th percentile for her gestational age).  There was
 normal amniotic fluid noted.  The distal edge of the normal-
 appearing anterior placenta appears to be covering the
 internal cervical os.  The majority of the placenta appears to
 be away from the cervix.  There were no sonographic signs of
 placenta accreta noted.
 The views of the fetal anatomy were limited today due to the
 fetal position.

 The implications and management of placenta previa was
 discussed with the patient and her husband today.  They
 were advised that as only the distal edge of the anterior
 placenta is covering the internal cervical os, I believe that
 there probably is a greater than 90% chance that the
 placenta previa will resolve later in her pregnancy.  She
 should continue to be followed with serial ultrasound exams
 every 4 to 5 weeks in your office to assess the fetal growth
 and placental location.  These ultrasounds may be performed
 in your office.

 They understand that should the placenta previa persist
 closer to the time of delivery, that a cesarean delivery will be
 recommended at 37 weeks.  We will be happy to see her for
 another ultrasound later in her pregnancy should there be
 any further questions regarding the placental location.

 As the patient is currently stable and no longer experiencing
 any vaginal bleeding, consideration may be given for
 discharge home on modified bedrest later this evening.  The
 second dose of antenatal corticosteroids may be given early
 so that she may be discharged at a reasonable hour.  Pelvic
 rest was advised.

 At the end of the consultation, the patient and her husband
 stated that all their questions have been answered to their
 complete satisfaction.

## 2021-09-12 IMAGING — US US MFM OB LIMITED
1 series · 15 of 28 positions shown · non-contrast
Comparison: none

[Series 1: us mfm ob limited · 41 acquisitions, 15 frames shown]
[im 1/41]
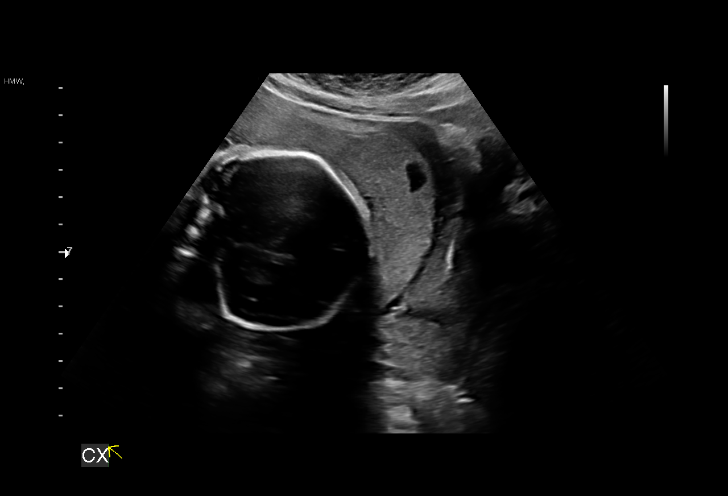
[im 3/41]
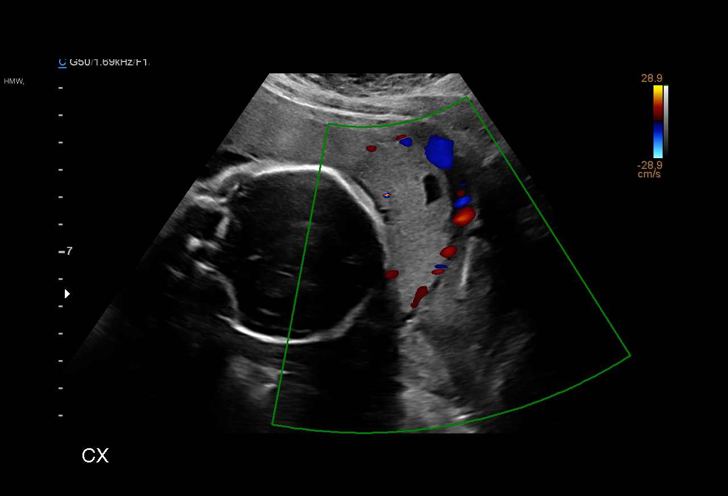
[im 6/41]
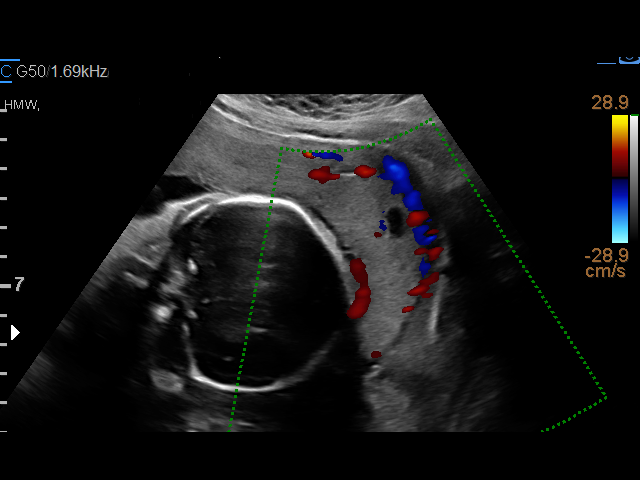
[im 9/41]
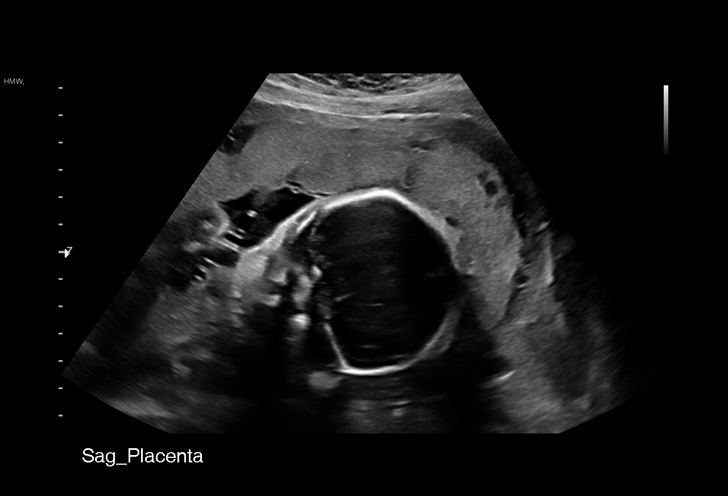
[im 12/41]
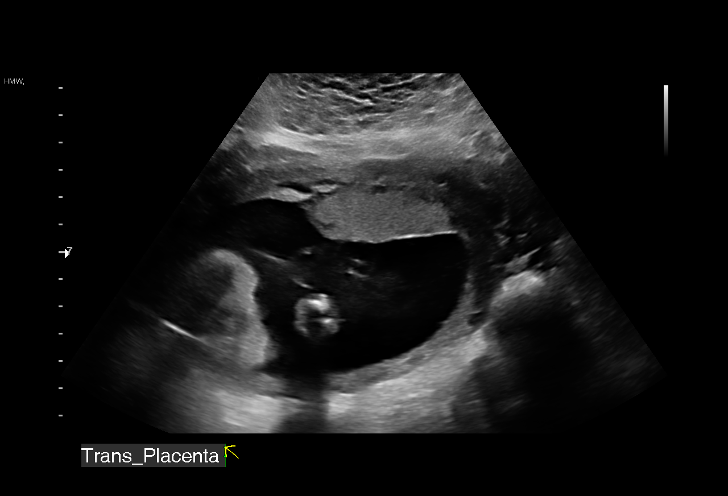
[im 15/41]
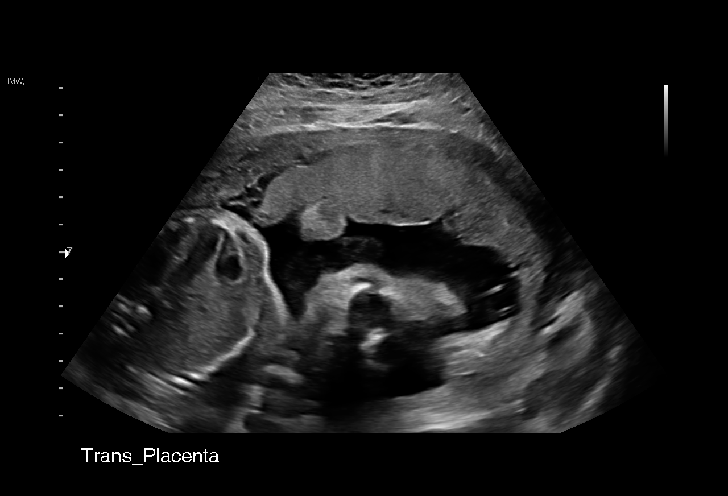
[im 18/41]
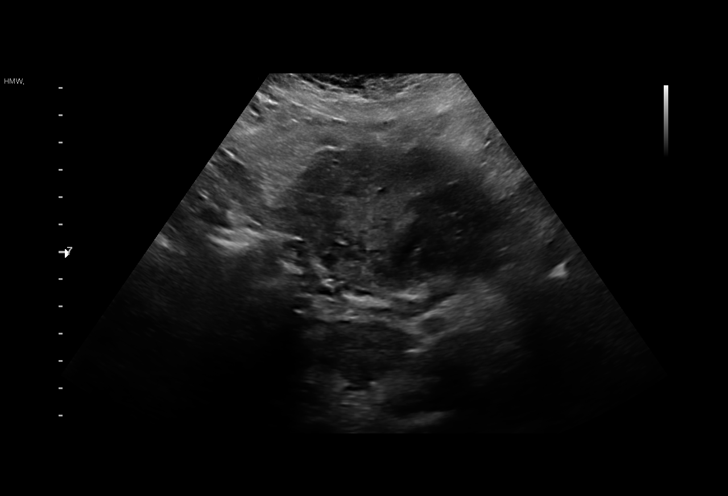
[im 21/41]
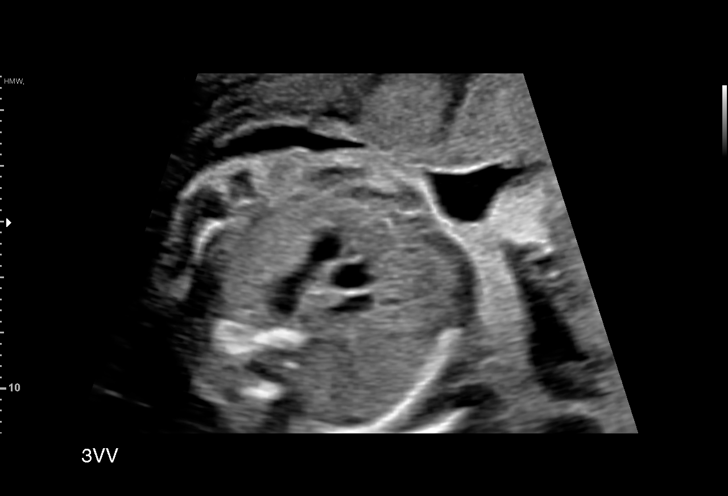
[im 23/41]
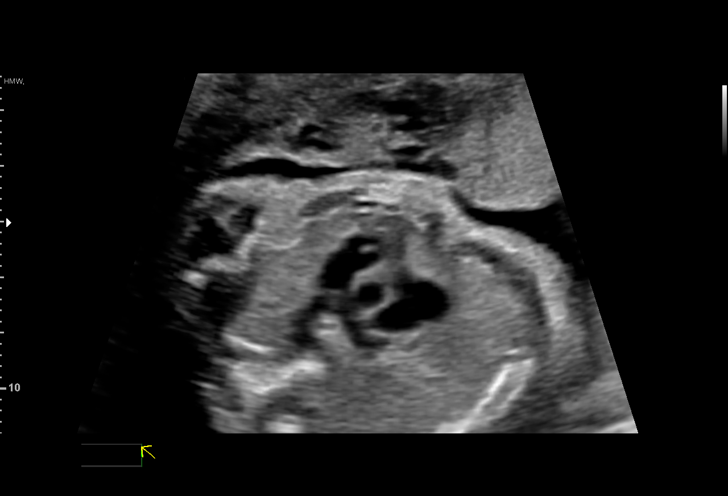
[im 26/41]
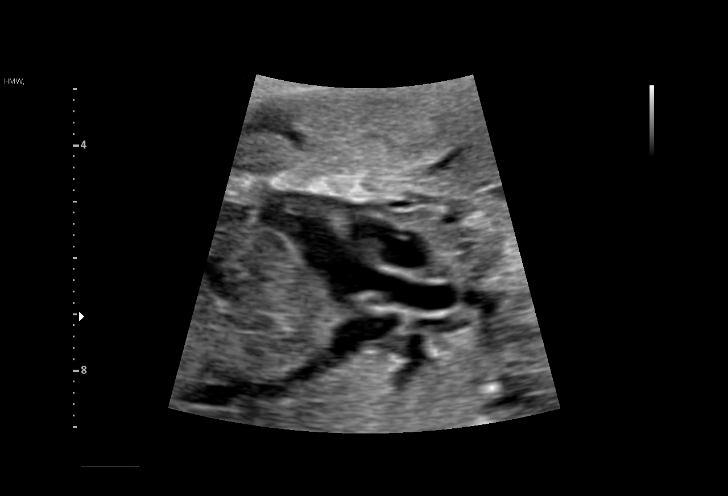
[im 29/41]
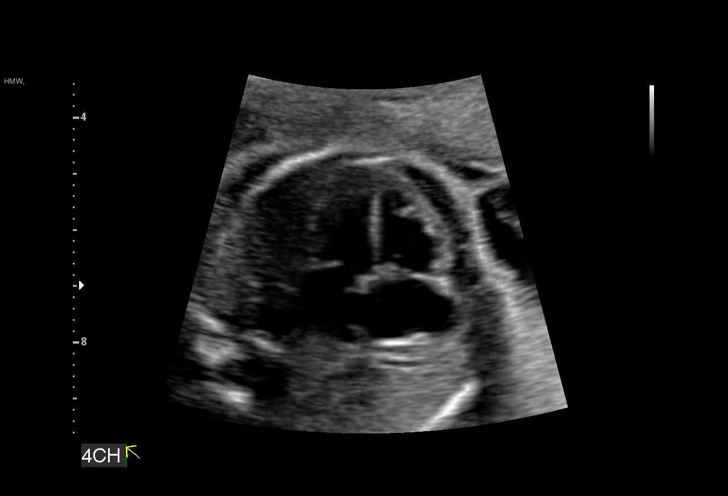
[im 32/41]
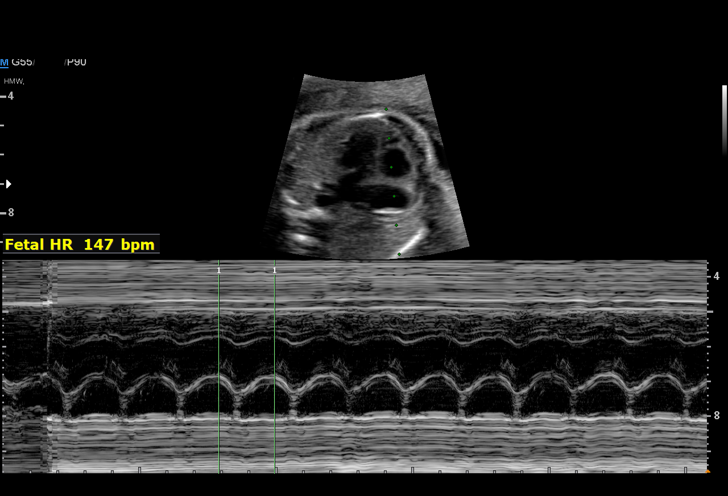
[im 35/41]
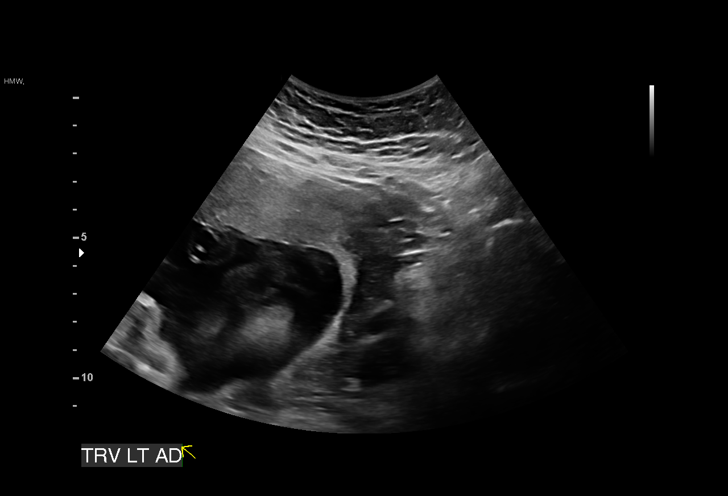
[im 38/41]
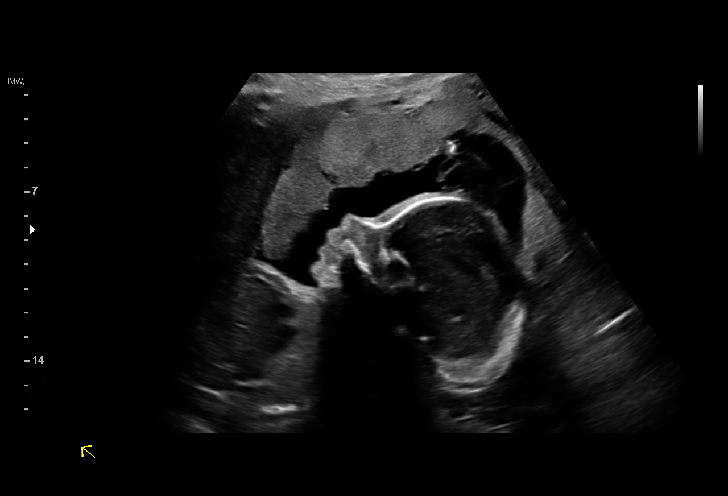
[im 41/41]
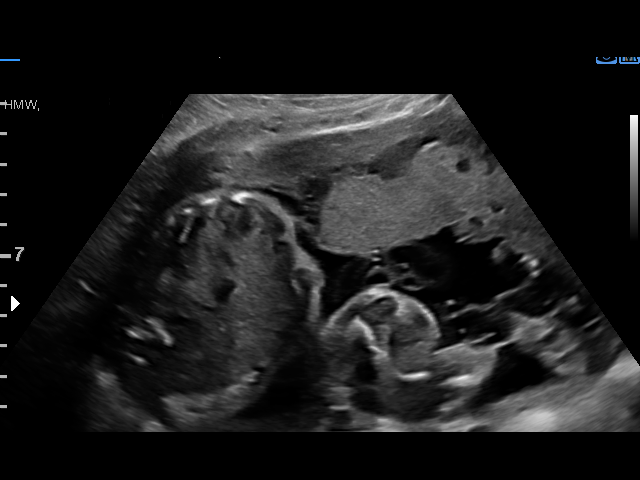

[15 of 28 positions shown; findings below may reference images not displayed]

1  US MFM OB LIMITED                     76815.01    TOMOTERU QUAINI

Indications

 Vaginal bleeding in pregnancy, second
 trimester
 25 weeks gestation of pregnancy
 Placenta previa specified as without
 hemorrhage, second trimester
 Encounter for other specified antenatal
 screening
Fetal Evaluation

 Num Of Fetuses:         1
 Fetal Heart Rate(bpm):  147
 Cardiac Activity:       Observed
 Presentation:           Cephalic
 Placenta:               Anterior previa
 P. Cord Insertion:      Previously Visualized

 Amniotic Fluid
 AFI FV:      Within normal limits

                             Largest Pocket(cm)
                             6
Gestational Age

 Clinical EDD:  25w 5d                                        EDD:   03/07/20
 Best:          25w 5d     Det. By:  Clinical EDD             EDD:   03/07/20
Anatomy

 Thoracic:              Appears normal         LVOT:                   Appears normal
 Heart:                 Appears normal         Stomach:                Appears normal, left
                        (4CH, axis, and                                sided
                        situs)
 RVOT:                  Appears normal         Bladder:                Appears normal
Cervix Uterus Adnexa

 Cervix
 Length:            2.5  cm.
 Closed

 Uterus
 No abnormality visualized.

 Right Ovary
 No adnexal mass visualized.

 Left Ovary
 No adnexal mass visualized.

 Cul De Sac
 No free fluid seen.

 Adnexa
 No abnormality visualized.
Impression

 Limited exam due to vaginal bleeding for known placenta
 previa (previous consultation performed).
 Placenta previa again seen
 There is normal amniotic fluid and fetal movement.
 Additional heart anatomy seen today.

 Per provider note bleeding resolved.

 We agree with continued inpatient management, she is Fahad/Papak
 [place] administration.
 Magnesium for neuprotection if delivery becomes imminent.
Recommendations

 Repeat growth in 4 weeks.
 Consider daily NST's while hospitalized.

## 2021-09-15 NOTE — Progress Notes (Unsigned)
[name] [address] [place] [address] Phone: [phone] Subjective:   INadine Counts, am serving as a scribe for Dr. Antoine Primas.  I'm seeing this patient by the request  of:  Ardith Dark, MD  CC: Neck and back pain follow-up  [id number]  Kirsten Hunt is a 36 y.o. female coming in with complaint of back and neck pain. OMT 07/22/2021. Patient states doing well. Would like to follow up on calf pain (was seen by Dr. Jean Rosenthal). Tried running this week. No pain while running, but the next day couldn't run. Here for manipulation.  Medications patient has been prescribed: None  Taking:         Reviewed prior external information including notes and imaging from previsou exam, outside providers and external EMR if available.   As well as notes that were available from care everywhere and other healthcare systems.  Past medical history, social, surgical and family history all reviewed in electronic medical record.  No pertanent information unless stated regarding to the chief complaint.   Past Medical History:  Diagnosis Date   Complication of anesthesia    History of vertigo    Hx of varicella    PONV (postoperative nausea and vomiting)     Allergies  Allergen Reactions   Lactose Intolerance (Gi) Other (See Comments)    Gas, stomach cramps     Review of Systems:  No headache, visual changes, nausea, vomiting, diarrhea, constipation, dizziness, abdominal pain, skin rash, fevers, chills, night sweats, weight loss, swollen lymph nodes, body aches, joint swelling, chest pain, shortness of breath, mood changes. POSITIVE muscle aches  Objective  Blood pressure 116/74, pulse 70, height 5\' 6"  (1.676 m), weight 151 lb (68.5 kg), SpO2 98 %, unknown if currently breastfeeding.   General: No apparent distress alert and oriented x3 mood and affect normal, dressed appropriately.  HEENT: Pupils equal, extraocular movements intact   Respiratory: Patient's speak in full sentences and does not appear short of breath  Cardiovascular: No lower extremity edema, non tender, no erythema  Gait MSK:  Back some tightness noted in the paraspinal musculature of the lumbar spine right greater than left. Patient's left calf does have tightness noted.  Patient does have some tightness noted with resisted flexion of the knee though this seems to be somewhat more hamstring oriented as well.  Neurovascularly intact now.  Osteopathic findings  C3 flexed rotated and side bent right C5 flexed rotated and side bent left T3 extended rotated and side bent right inhaled rib T9 extended rotated and side bent left L2 flexed rotated and side bent right Sacrum right on right       Assessment and Plan:  SI (sacroiliac) joint dysfunction With mild exacerbation with patient having the same.  Discussed with patient about icing regimen and home exercises.  Patient will get a heel lift.  He can use the meloxicam daily for 5 days as needed.  Discussed doing the stretches after activity as well.  Follow-up again in 6-8  Strain of calf muscle, initial encounter Strain noted, but doing better still discomfort overall.  Discussed icing regimen and exercises.  Increase activity slowly.  Follow-up again in 6 to 8 weeks.    Nonallopathic problems  Decision today to treat with OMT was based on Physical Exam  After verbal consent patient was treated with HVLA, ME, FPR techniques in cervical, rib thoracic, lumbar, and sacral  areas  Patient tolerated the procedure well with improvement  in symptoms  Patient given exercises, stretches and lifestyle modifications  See medications in patient instructions if given  Patient will follow up in 4-8 weeks    The above documentation has been reviewed and is accurate and complete Lyndal Pulley, DO          Note: This dictation was prepared with Dragon dictation along with smaller phrase  technology. Any transcriptional errors that result from this process are unintentional.

## 2021-09-16 ENCOUNTER — Ambulatory Visit (INDEPENDENT_AMBULATORY_CARE_PROVIDER_SITE_OTHER): Payer: BC Managed Care – PPO | Admitting: Family Medicine

## 2021-09-16 ENCOUNTER — Encounter: Payer: Self-pay | Admitting: Family Medicine

## 2021-09-16 VITALS — BP 116/74 | HR 70 | Ht 66.0 in | Wt 151.0 lb

## 2021-09-16 DIAGNOSIS — M9904 Segmental and somatic dysfunction of sacral region: Secondary | ICD-10-CM | POA: Diagnosis not present

## 2021-09-16 DIAGNOSIS — S86819A Strain of other muscle(s) and tendon(s) at lower leg level, unspecified leg, initial encounter: Secondary | ICD-10-CM | POA: Diagnosis not present

## 2021-09-16 DIAGNOSIS — M9903 Segmental and somatic dysfunction of lumbar region: Secondary | ICD-10-CM | POA: Diagnosis not present

## 2021-09-16 DIAGNOSIS — M9902 Segmental and somatic dysfunction of thoracic region: Secondary | ICD-10-CM | POA: Diagnosis not present

## 2021-09-16 DIAGNOSIS — M533 Sacrococcygeal disorders, not elsewhere classified: Secondary | ICD-10-CM | POA: Diagnosis not present

## 2021-09-16 NOTE — Assessment & Plan Note (Signed)
With mild exacerbation with patient having the same.  Discussed with patient about icing regimen and home exercises.  Patient will get a heel lift.  He can use the meloxicam daily for 5 days as needed.  Discussed doing the stretches after activity as well.  Follow-up again in 6-8

## 2021-09-16 NOTE — Patient Instructions (Addendum)
Good to see you! Heel lift in daily shoes [place], [place], [place] carbon X Recovery sandals (hoka or oofos) in house

## 2021-09-16 NOTE — Assessment & Plan Note (Signed)
Strain noted, but doing better still discomfort overall.  Discussed icing regimen and exercises.  Increase activity slowly.  Follow-up again in 6 to 8 weeks.

## 2021-09-21 ENCOUNTER — Encounter: Payer: Self-pay | Admitting: Family Medicine

## 2021-09-21 ENCOUNTER — Ambulatory Visit (INDEPENDENT_AMBULATORY_CARE_PROVIDER_SITE_OTHER): Payer: BC Managed Care – PPO | Admitting: Family Medicine

## 2021-09-21 VITALS — BP 112/73 | HR 74 | Temp 98.3°F | Ht 66.0 in | Wt 150.0 lb

## 2021-09-21 DIAGNOSIS — Z23 Encounter for immunization: Secondary | ICD-10-CM

## 2021-09-21 DIAGNOSIS — R42 Dizziness and giddiness: Secondary | ICD-10-CM

## 2021-09-21 MED ORDER — ONDANSETRON HCL 4 MG PO TABS: 4.0000 mg | ORAL_TABLET | Freq: Three times a day (TID) | ORAL | 0 refills | Status: AC | PRN

## 2021-09-21 MED ORDER — MECLIZINE HCL 25 MG PO TABS: 25.0000 mg | ORAL_TABLET | Freq: Three times a day (TID) | ORAL | 0 refills | Status: AC | PRN

## 2021-09-21 NOTE — Patient Instructions (Signed)
It was very nice to see you today!  You have vertigo.  Please take the meclizine and Zofran as needed.  Please continue working on the Epley maneuvers.  Let me know if not improving in the next several days.  Take care, Dr Jerline Pain  PLEASE NOTE:  If you had any lab tests please let us know if you have not heard back within a few days. You may see your results on mychart before we have a chance to review them but we will give you a call once they are reviewed by [place]. If we ordered any referrals today, please let us know if you have not heard from their office within the next week.   Please try these tips to maintain a healthy lifestyle:  Eat at least 3 REAL meals and 1-2 snacks per day.  Aim for no more than 5 hours between eating.  If you eat breakfast, please do so within one hour of getting up.   Each meal should contain half fruits/vegetables, one quarter protein, and one quarter carbs (no bigger than a computer mouse)  Cut down on sweet beverages. This includes juice, soda, and sweet tea.   Drink at least 1 glass of water with each meal and aim for at least 8 glasses per day  Exercise at least 150 minutes every week.    How to Perform the Epley Maneuver The Epley maneuver is an exercise that relieves symptoms of vertigo. Vertigo is the feeling that you or your surroundings are moving when they are not. When you feel vertigo, you may feel like the room is spinning and may have trouble walking. The Epley maneuver is used for a type of vertigo caused by a calcium deposit in a part of the inner ear. The maneuver involves changing head positions to help the deposit move out of the area. You can do this maneuver at home whenever you have symptoms of vertigo. You can repeat it in 24 hours if your vertigo has not gone away. Even though the Epley maneuver may relieve your vertigo for a few weeks, it is possible that your symptoms will return. This maneuver relieves vertigo, but it does not relieve  dizziness. What are the risks? If it is done correctly, the Epley maneuver is considered safe. Sometimes it can lead to dizziness or nausea that goes away after a short time. If you develop other symptoms--such as changes in vision, weakness, or numbness--stop doing the maneuver and call your health care provider. Supplies needed: A bed or table. A pillow. How to do the Epley maneuver     Sit on the edge of a bed or table with your back straight and your legs extended or hanging over the edge of the bed or table. Turn your head halfway toward the affected ear or side as told by your health care provider. Lie backward quickly with your head turned until you are lying flat on your back. Your head should dangle (head-hanging position). You may want to position a pillow under your shoulders. Hold this position for at least 30 seconds. If you feel dizzy or have symptoms of vertigo, continue to hold the position until the symptoms stop. Turn your head to the opposite direction until your unaffected ear is facing down. Your head should continue to dangle. Hold this position for at least 30 seconds. If you feel dizzy or have symptoms of vertigo, continue to hold the position until the symptoms stop. Turn your whole body to the  same side as your head so that you are positioned on your side. Your head will now be nearly facedown and no longer needs to dangle. Hold for at least 30 seconds. If you feel dizzy or have symptoms of vertigo, continue to hold the position until the symptoms stop. Sit back up. You can repeat the maneuver in 24 hours if your vertigo does not go away. Follow these instructions at home: For 24 hours after doing the Epley maneuver: Keep your head in an upright position. When lying down to sleep or rest, keep your head raised (elevated) with two or more pillows. Avoid excessive neck movements. Activity Do not drive or use machinery if you feel dizzy. After doing the Epley  maneuver, return to your normal activities as told by your health care provider. Ask your health care provider what activities are safe for you. General instructions Drink enough fluid to keep your urine pale yellow. Do not drink alcohol. Take over-the-counter and prescription medicines only as told by your health care provider. Keep all follow-up visits. This is important. Preventing vertigo symptoms Ask your health care provider if there is anything you should do at home to prevent vertigo. He or she may recommend that you: Keep your head elevated with two or more pillows while you sleep. Do not sleep on the side of your affected ear. Get up slowly from bed. Avoid sudden movements during the day. Avoid extreme head positions or movement, such as looking up or bending over. Contact a health care provider if: Your vertigo gets worse. You have other symptoms, including: Nausea. Vomiting. Headache. Get help right away if you: Have vision changes. Have a headache or neck pain that is severe or getting worse. Cannot stop vomiting. Have new numbness or weakness in any part of your body. These symptoms may represent a serious problem that is an emergency. Do not wait to see if the symptoms will go away. Get medical help right away. Call your local emergency services (911 in the [place] Do not drive yourself to the hospital. Summary Vertigo is the feeling that you or your surroundings are moving when they are not. The Epley maneuver is an exercise that relieves symptoms of vertigo. If the Epley maneuver is done correctly, it is considered safe. This information is not intended to replace advice given to you by your health care provider. Make sure you discuss any questions you have with your health care provider. Document Revised: 11/21/2019 Document Reviewed: 11/21/2019 Elsevier Patient Education  [employer].

## 2021-09-21 NOTE — Progress Notes (Signed)
   Kirsten Hunt is a 36 y.o. female who presents today for an office visit.  Assessment/Plan:  Vertigo No red flags.  Likely has flare of BPPV.  Has a reassuring neuro exam today.  Epley maneuver performed in the office today with some exacerbation of symptoms however she responded well to this.  We will start meclizine and Zofran.  She can continue Epley maneuver's at home.  We discussed referral to vestibular rehab however she declined.  We discussed reasons return to care.  Follow-up as needed.    Subjective:  HPI:  Patient here with dizziness.  Consistent with prior episode of vertigo that happened several years ago.  Current episode started a couple days ago with room spinning sensation.  She has had some associated nausea.  No vomiting.  Does not have any symptoms at rest however does notice worsening symptoms with any sort of movement.  No weakness or numbness.  No vision changes.  She tried doing Epley maneuver at home which seems to have helped modestly.       Objective:  Physical Exam: BP 112/73   Pulse 74   Temp 98.3 F (36.8 C) (Temporal)   Ht 5\' 6"  (1.676 m)   Wt 150 lb (68 kg)   LMP 09/16/2021   SpO2 97%   BMI 24.21 kg/m   Gen: No acute distress, resting comfortably CV: Regular rate and rhythm with no murmurs appreciated Pulm: Normal work of breathing, clear to auscultation bilaterally with no crackles, wheezes, or rhonchi Neuro: Cranial nerves II through XII intact.  Finger-nose-finger testing intact bilaterally. Psych: Normal affect and thought content      Keaton Beichner M. Jerline Pain, MD 09/21/2021 10:42 AM

## 2021-09-22 ENCOUNTER — Encounter: Payer: Self-pay | Admitting: Family Medicine

## 2021-09-22 NOTE — Progress Notes (Deleted)
  [place] Phone: [phone] Subjective:    I'm seeing this patient by the request  of:  Vivi Barrack, MD  CC:   NOI:[id number]  Kirsten Hunt is a 36 y.o. female coming in with complaint of back and neck pain. OMT 09/16/2021. Patient states   Medications patient has been prescribed: None  Taking:         Reviewed prior external information including notes and imaging from previsou exam, outside providers and external EMR if available.   As well as notes that were available from care everywhere and other healthcare systems.  Past medical history, social, surgical and family history all reviewed in electronic medical record.  No pertanent information unless stated regarding to the chief complaint.   Past Medical History:  Diagnosis Date   Complication of anesthesia    History of vertigo    Hx of varicella    PONV (postoperative nausea and vomiting)     Allergies  Allergen Reactions   Lactose Intolerance (Gi) Other (See Comments)    Gas, stomach cramps     Review of Systems:  No headache, visual changes, nausea, vomiting, diarrhea, constipation, dizziness, abdominal pain, skin rash, fevers, chills, night sweats, weight loss, swollen lymph nodes, body aches, joint swelling, chest pain, shortness of breath, mood changes. POSITIVE muscle aches  Objective  Last menstrual period 09/16/2021, unknown if currently breastfeeding.   General: No apparent distress alert and oriented x3 mood and affect normal, dressed appropriately.  HEENT: Pupils equal, extraocular movements intact  Respiratory: Patient's speak in full sentences and does not appear short of breath  Cardiovascular: No lower extremity edema, non tender, no erythema  Gait MSK:  Back   Osteopathic findings  C2 flexed rotated and side bent right C6 flexed rotated and side bent left T3 extended rotated and side bent right inhaled rib T9  extended rotated and side bent left L2 flexed rotated and side bent right Sacrum right on right       Assessment and Plan:  No problem-specific Assessment & Plan notes found for this encounter.    Nonallopathic problems  Decision today to treat with OMT was based on Physical Exam  After verbal consent patient was treated with HVLA, ME, FPR techniques in cervical, rib, thoracic, lumbar, and sacral  areas  Patient tolerated the procedure well with improvement in symptoms  Patient given exercises, stretches and lifestyle modifications  See medications in patient instructions if given  Patient will follow up in 4-8 weeks             Note: This dictation was prepared with Dragon dictation along with smaller phrase technology. Any transcriptional errors that result from this process are unintentional.

## 2021-09-23 ENCOUNTER — Ambulatory Visit: Payer: BC Managed Care – PPO | Admitting: Family Medicine

## 2021-09-23 NOTE — Telephone Encounter (Signed)
Ok to place referral to PT for vertigo.  Algis Greenhouse. Jerline Pain, MD 09/23/2021 9:25 AM

## 2021-09-28 ENCOUNTER — Other Ambulatory Visit: Payer: Self-pay | Admitting: *Deleted

## 2021-09-28 DIAGNOSIS — R42 Dizziness and giddiness: Secondary | ICD-10-CM

## 2021-09-28 NOTE — Telephone Encounter (Signed)
Referral placed.

## 2021-10-29 ENCOUNTER — Ambulatory Visit: Payer: BC Managed Care – PPO | Admitting: Physical Therapy

## 2021-11-02 NOTE — Progress Notes (Signed)
[place] Phone: [phone] Subjective:   Fontaine No, am serving as a scribe for Dr. Hulan Saas.  I'm seeing this patient by the request  of:  Vivi Barrack, MD  CC: Neck and low back pain follow-up  [id number]  Kirsten Hunt is a 36 y.o. female coming in with complaint of back and neck pain. OMT 09/16/2021. Patient states that she experienced vertigo not long after her last appt. Wants to know what to do to prevent these symptoms. Has had vertigo once before that kept her in bed for 3 days. No longer having symptoms but is aware of posture as lying on her back increases her symptoms.   Is about to run a 1/2 marathon in 2 weeks.  Feels like she is going to be able to do it though.  Personal recommendations for primary care provider.   Medications patient has been prescribed: None  Taking:         Reviewed prior external information including notes and imaging from previsou exam, outside providers and external EMR if available.   As well as notes that were available from care everywhere and other healthcare systems.  Past medical history, social, surgical and family history all reviewed in electronic medical record.  No pertanent information unless stated regarding to the chief complaint.   Past Medical History:  Diagnosis Date   Complication of anesthesia    History of vertigo    Hx of varicella    PONV (postoperative nausea and vomiting)     Allergies  Allergen Reactions   Lactose Intolerance (Gi) Other (See Comments)    Gas, stomach cramps     Review of Systems:  No headache, visual changes, nausea, vomiting, diarrhea, constipation, dizziness, abdominal pain, skin rash, fevers, chills, night sweats, weight loss, swollen lymph nodes, body aches, joint swelling, chest pain, shortness of breath, mood changes. POSITIVE muscle aches  Objective  Blood pressure 106/68, pulse 93, height  5\' 6"  (1.676 m), weight 154 lb (69.9 kg), SpO2 99 %, unknown if currently breastfeeding.   General: No apparent distress alert and oriented x3 mood and affect normal, dressed appropriately.  HEENT: Pupils equal, extraocular movements intact  Respiratory: Patient's speak in full sentences and does not appear short of breath  Cardiovascular: No lower extremity edema, non tender, no erythema  Gait normal MSK:  Back low back does have some mild tightness in the paraspinal musculature of the lumbar spine right greater than left.  Neck exam does have some limited sidebending with left-sided neck pain as well noted.  Right-sided the parascapular region noted.  Osteopathic findings  C3 flexed rotated and side bent left C6 flexed rotated and side bent left T3 extended rotated and side bent right inhaled rib T8 extended rotated and side bent right L1 flexed rotated and side bent right Sacrum right on right       Assessment and Plan:  No problem-specific Assessment & Plan notes found for this encounter.    Nonallopathic problems  Decision today to treat with OMT was based on Physical Exam  After verbal consent patient was treated with HVLA, ME, FPR techniques in cervical, rib, thoracic, lumbar, and sacral  areas  Patient tolerated the procedure well with improvement in symptoms  Patient given exercises, stretches and lifestyle modifications  See medications in patient instructions if given  Patient will follow up in 4-8 weeks    The above documentation has been reviewed  and is accurate and complete Kirsten Pulley, DO          Note: This dictation was prepared with Dragon dictation along with smaller phrase technology. Any transcriptional errors that result from this process are unintentional.

## 2021-11-09 ENCOUNTER — Ambulatory Visit (INDEPENDENT_AMBULATORY_CARE_PROVIDER_SITE_OTHER): Payer: BC Managed Care – PPO | Admitting: Family Medicine

## 2021-11-09 VITALS — BP 106/68 | HR 93 | Ht 66.0 in | Wt 154.0 lb

## 2021-11-09 DIAGNOSIS — M9903 Segmental and somatic dysfunction of lumbar region: Secondary | ICD-10-CM | POA: Diagnosis not present

## 2021-11-09 DIAGNOSIS — M9908 Segmental and somatic dysfunction of rib cage: Secondary | ICD-10-CM

## 2021-11-09 DIAGNOSIS — M9904 Segmental and somatic dysfunction of sacral region: Secondary | ICD-10-CM | POA: Diagnosis not present

## 2021-11-09 DIAGNOSIS — M533 Sacrococcygeal disorders, not elsewhere classified: Secondary | ICD-10-CM | POA: Diagnosis not present

## 2021-11-09 DIAGNOSIS — M9902 Segmental and somatic dysfunction of thoracic region: Secondary | ICD-10-CM

## 2021-11-09 DIAGNOSIS — M9901 Segmental and somatic dysfunction of cervical region: Secondary | ICD-10-CM

## 2021-11-09 NOTE — Assessment & Plan Note (Signed)
Chronic problem.  Patient did have mild exacerbation.  Still is taking care of her children and doing a lot of repetitive flexion that I think contributes to this.  Discussed icing regimen and home exercises, discussed which activities to do and which ones to avoid.  Increase activity slowly otherwise.  Follow-up again in 6 to 8 weeks

## 2021-11-09 NOTE — Patient Instructions (Addendum)
Good to see you Planks and [employer] in each n nostril for 2 weeks See me in 5-6 weeks

## 2021-12-01 NOTE — Therapy (Signed)
OUTPATIENT PHYSICAL THERAPY VESTIBULAR EVALUATION     Patient Name: Kirsten Hunt MRN: [id number] DOB:20-Jul-1985, 36 y.o., female Today's Date: 12/01/2021  END OF SESSION:   Past Medical History:  Diagnosis Date   Complication of anesthesia    History of vertigo    Hx of varicella    PONV (postoperative nausea and vomiting)    Past Surgical History:  Procedure Laterality Date   CESAREAN SECTION N/A 02/01/2020   Procedure: PRIMARY CESAREAN SECTION EDC: 03-07-20  ALLERGIES: NKDA;  Surgeon: [name], DO;  Location: [facility]S;  Service: Obstetrics;  Laterality: N/A;   WISDOM TOOTH EXTRACTION     Patient Active Problem List   Diagnosis Date Noted   Strain of calf muscle, initial encounter 09/16/2021   Dyslipidemia 12/01/2020   SI (sacroiliac) joint dysfunction 09/05/2018   Nonallopathic lesion of sacral region 09/05/2018   Nonallopathic lesion of lumbosacral region 09/05/2018    PCP:  Ardith Dark, MD REFERRING PROVIDER:  Ardith Dark, MD  REFERRING DIAG: R42 (ICD-10-CM) - Vertigo   THERAPY DIAG:  No diagnosis found.  ONSET DATE:  09/28/2021  Rationale for Evaluation and Treatment: {HABREHAB:27488}  SUBJECTIVE:   SUBJECTIVE STATEMENT: *** Pt accompanied by: {accompnied:27141}  PERTINENT HISTORY: ***  PAIN:  Are you having pain? {OPRCPAIN:27236}  PRECAUTIONS: {Therapy precautions:24002}  WEIGHT BEARING RESTRICTIONS: {Yes ***/No:24003}  FALLS: Has patient fallen in last 6 months? {fallsyesno:27318}  LIVING ENVIRONMENT: Lives with: {OPRC lives with:25569::"lives with their family"} Lives in: {Lives in:25570} Stairs: {opstairs:27293} Has following equipment at home: {Assistive devices:23999}  PLOF: {PLOF:24004}  PATIENT GOALS: ***  OBJECTIVE:   DIAGNOSTIC FINDINGS: ***  COGNITION: Overall cognitive status: {cognition:24006}   SENSATION: {sensation:27233}  EDEMA:  {edema:24020}  MUSCLE TONE:  {LE tone:25568}  DTRs:  {DTR  SITE:24025}  POSTURE:  {posture:25561}  Cervical ROM:    {AROM/PROM:27142} A/PROM (deg) eval  Flexion   Extension   Right lateral flexion   Left lateral flexion   Right rotation   Left rotation   (Blank rows = not tested)  STRENGTH: ***  LOWER EXTREMITY MMT:   MMT Right eval Left eval  Hip flexion    Hip abduction    Hip adduction    Hip internal rotation    Hip external rotation    Knee flexion    Knee extension    Ankle dorsiflexion    Ankle plantarflexion    Ankle inversion    Ankle eversion    (Blank rows = not tested)  BED MOBILITY:  {Bed mobility:24027}  TRANSFERS: Assistive device utilized: {Assistive devices:23999}  Sit to stand: {Levels of assistance:24026} Stand to sit: {Levels of assistance:24026} Chair to chair: {Levels of assistance:24026} Floor: {Levels of assistance:24026}  RAMP: {Levels of assistance:24026}  CURB: {Levels of assistance:24026}  GAIT: Gait pattern: {gait characteristics:25376} Distance walked: *** Assistive device utilized: {Assistive devices:23999} Level of assistance: {Levels of assistance:24026} Comments: ***  FUNCTIONAL TESTS:  {Functional tests:24029}  PATIENT SURVEYS:  {rehab surveys:24030}  VESTIBULAR ASSESSMENT:  GENERAL OBSERVATION: ***   SYMPTOM BEHAVIOR:  Subjective history: ***  Non-Vestibular symptoms: {nonvestibular symptoms:25260}  Type of dizziness: {Type of Dizziness:25255}  Frequency: ***  Duration: ***  Aggravating factors: {Aggravating Factors:25258}  Relieving factors: {Relieving Factors:25259}  Progression of symptoms: {DESC; BETTER/WORSE:18575}  OCULOMOTOR EXAM:  Ocular Alignment: {Ocular Alignment:25262}  Ocular ROM: {RANGE OF MOTION:21649}  Spontaneous Nystagmus: {Spontaneous nystagmus:25263}  Gaze-Induced Nystagmus: {gaze-induced nystagmus:25264}  Smooth Pursuits: {smooth pursuit:25265}  Saccades: {saccades:25266}  Convergence/Divergence: *** cm   FRENZEL - FIXATION  SUPRESSED:  Ocular Alignment: {Ocular Alignment:25262}  Spontaneous Nystagmus: {Spontaneous nystagmus:25263}  Gaze-Induced Nystagmus: {gaze-induced nystagmus:25264}  Horizontal head shaking - induced nystagmus: {head shaking induced nystagmus:25267}  Vertical head shaking - induced nystagmus: {head shaking induced nystagmus:25267}  Positional tests: {Positional tests:25271}  Pressure tests: {frenzel pressure tests:25268}  VESTIBULAR - OCULAR REFLEX:   Slow VOR: {slow VOR:25290}  VOR Cancellation: {vor cancellation:25291}  Head-Impulse Test: {head impulse test:25272}  Dynamic Visual Acuity: {dynamic visual acuity:25273}   POSITIONAL TESTING: {Positional tests:25271}  MOTION SENSITIVITY:  Motion Sensitivity Quotient Intensity: 0 = none, 1 = Lightheaded, 2 = Mild, 3 = Moderate, 4 = Severe, 5 = Vomiting  Intensity  1. Sitting to supine   2. Supine to L side   3. Supine to R side   4. Supine to sitting   5. L Hallpike-Dix   6. Up from L    7. R Hallpike-Dix   8. Up from R    9. Sitting, head tipped to L knee   10. Head up from L knee   11. Sitting, head tipped to R knee   12. Head up from R knee   13. Sitting head turns x5   14.Sitting head nods x5   15. In stance, 180 turn to L    16. In stance, 180 turn to R     OTHOSTATICS: {Exam; orthostatics:31331}  FUNCTIONAL GAIT: {Functional tests:24029}   VESTIBULAR TREATMENT:                                                                                                   DATE: ***  Canalith Repositioning:  {Canalith Repositioning:25283} Gaze Adaptation:  {gaze adaptation:25286} Habituation:  {habituation:25288} Other: ***  PATIENT EDUCATION: Education details: *** Person educated: {Person educated:25204} Education method: {Education Method:25205} Education comprehension: {Education Comprehension:25206}  HOME EXERCISE PROGRAM:  GOALS: Goals reviewed with patient? {yes/no:20286}  SHORT TERM GOALS: Target date:  ***  *** Baseline: Goal status: {GOALSTATUS:25110}  2.  *** Baseline:  Goal status: {GOALSTATUS:25110}  3.  *** Baseline:  Goal status: {GOALSTATUS:25110}  4.  *** Baseline:  Goal status: {GOALSTATUS:25110}  5.  *** Baseline:  Goal status: {GOALSTATUS:25110}  6.  *** Baseline:  Goal status: {GOALSTATUS:25110}  LONG TERM GOALS: Target date: ***  *** Baseline:  Goal status: {GOALSTATUS:25110}  2.  *** Baseline:  Goal status: {GOALSTATUS:25110}  3.  *** Baseline:  Goal status: {GOALSTATUS:25110}  4.  *** Baseline:  Goal status: {GOALSTATUS:25110}  5.  *** Baseline:  Goal status: {GOALSTATUS:25110}  6.  *** Baseline:  Goal status: {GOALSTATUS:25110}  ASSESSMENT:  CLINICAL IMPRESSION: Patient is a *** y.o. *** who was seen today for physical therapy evaluation and treatment for ***.   OBJECTIVE IMPAIRMENTS: {opptimpairments:25111}.   ACTIVITY LIMITATIONS: {activitylimitations:27494}  PARTICIPATION LIMITATIONS: {participationrestrictions:25113}  PERSONAL FACTORS: {Personal factors:25162} are also affecting patient's functional outcome.   REHAB POTENTIAL: {rehabpotential:25112}  CLINICAL DECISION MAKING: {clinical decision making:25114}  EVALUATION COMPLEXITY: {Evaluation complexity:25115}   PLAN:  PT FREQUENCY: {rehab frequency:25116}  PT DURATION: {rehab duration:25117}  PLANNED INTERVENTIONS: {rehab planned interventions:25118::"Therapeutic exercises","Therapeutic activity","Neuromuscular re-education","Balance training","Gait training","Patient/Family education","Self Care","Joint mobilization"}  PLAN FOR NEXT  SESSION: Drake Leach, PT, DPT  12/01/2021, 3:07 PM

## 2021-12-02 ENCOUNTER — Ambulatory Visit: Payer: BC Managed Care – PPO | Attending: Family Medicine | Admitting: Physical Therapy

## 2021-12-02 DIAGNOSIS — R42 Dizziness and giddiness: Secondary | ICD-10-CM | POA: Insufficient documentation

## 2021-12-23 NOTE — Progress Notes (Signed)
  [name] [address] [place] [address] Phone: [phone] Subjective:    I'm seeing this patient by the request  of:  Ardith Dark, MD  CC: Low back pain follow-up  WLN:[id number]  Kirsten Hunt is a 36 y.o. female coming in with complaint of back and neck pain. OMT 11/09/2021. Patient states that she is good has very mild discomfort and pain.  Feels though that the osteopathic manipulation has been helpful.  Medications patient has been prescribed: None  Taking:         Reviewed prior external information including notes and imaging from previsou exam, outside providers and external EMR if available.   As well as notes that were available from care everywhere and other healthcare systems.  Past medical history, social, surgical and family history all reviewed in electronic medical record.  No pertanent information unless stated regarding to the chief complaint.   Past Medical History:  Diagnosis Date   Complication of anesthesia    History of vertigo    Hx of varicella    PONV (postoperative nausea and vomiting)     Allergies  Allergen Reactions   Lactose Intolerance (Gi) Other (See Comments)    Gas, stomach cramps     Review of Systems:  No headache, visual changes, nausea, vomiting, diarrhea, constipation, dizziness, abdominal pain, skin rash, fevers, chills, night sweats, weight loss, swollen lymph nodes, body aches, joint swelling, chest pain, shortness of breath, mood changes. POSITIVE muscle aches  Objective  Blood pressure 118/80, pulse 93, height 5\' 6"  (1.676 m), weight 156 lb (70.8 kg), SpO2 100 %, unknown if currently breastfeeding.   General: No apparent distress alert and oriented x3 mood and affect normal, dressed appropriately.  HEENT: Pupils equal, extraocular movements intact  Respiratory: Patient's speak in full sentences and does not appear short of breath  Cardiovascular: No lower extremity edema, non  tender, no erythema  Low back exam does have some loss of lordosis.  Some tenderness to palpation in the paraspinal musculature.  Osteopathic findings  T9 extended rotated and side bent left L3 flexed rotated and side bent right Sacrum right on right       Assessment and Plan:  SI (sacroiliac) joint dysfunction Patient does respond extremely well to osteopathic manipulation.  Discussed posture and ergonomics.  Patient will continue to work on core strengthening.  Follow-up with me again in 2 to 3 months    Nonallopathic problems  Decision today to treat with OMT was based on Physical Exam  After verbal consent patient was treated with HVLA, ME, FPR techniques in  thoracic, lumbar, and sacral  areas  Patient tolerated the procedure well with improvement in symptoms  Patient given exercises, stretches and lifestyle modifications  See medications in patient instructions if given  Patient will follow up in 4-8 weeks    The above documentation has been reviewed and is accurate and complete , DO          Note: This dictation was prepared with Dragon dictation along with smaller phrase technology. Any transcriptional errors that result from this process are unintentional.

## 2021-12-31 ENCOUNTER — Ambulatory Visit (INDEPENDENT_AMBULATORY_CARE_PROVIDER_SITE_OTHER): Payer: BC Managed Care – PPO | Admitting: Family Medicine

## 2021-12-31 VITALS — BP 118/80 | HR 93 | Ht 66.0 in | Wt 156.0 lb

## 2021-12-31 DIAGNOSIS — M533 Sacrococcygeal disorders, not elsewhere classified: Secondary | ICD-10-CM

## 2021-12-31 DIAGNOSIS — M9903 Segmental and somatic dysfunction of lumbar region: Secondary | ICD-10-CM

## 2021-12-31 DIAGNOSIS — M9904 Segmental and somatic dysfunction of sacral region: Secondary | ICD-10-CM

## 2021-12-31 DIAGNOSIS — M9902 Segmental and somatic dysfunction of thoracic region: Secondary | ICD-10-CM | POA: Diagnosis not present

## 2021-12-31 NOTE — Assessment & Plan Note (Signed)
Patient does respond extremely well to osteopathic manipulation.  Discussed posture and ergonomics.  Patient will continue to work on core strengthening.  Follow-up with me again in 2 to 3 months

## 2022-02-17 DIAGNOSIS — L538 Other specified erythematous conditions: Secondary | ICD-10-CM | POA: Diagnosis not present

## 2022-02-17 DIAGNOSIS — L820 Inflamed seborrheic keratosis: Secondary | ICD-10-CM | POA: Diagnosis not present

## 2022-02-17 DIAGNOSIS — Z789 Other specified health status: Secondary | ICD-10-CM | POA: Diagnosis not present

## 2022-02-17 DIAGNOSIS — L601 Onycholysis: Secondary | ICD-10-CM | POA: Diagnosis not present

## 2022-03-23 NOTE — Progress Notes (Unsigned)
Kirsten Hunt [address] [place] Phone: [phone] Subjective:   Kirsten Hunt, am serving as a scribe for Dr. Hulan Saas.  I'm seeing this patient by the request  of:  Vivi Barrack, MD  CC: Back and neck pain follow-up  [id number]  Kirsten Hunt is a 37 y.o. female coming in with complaint of back and neck pain. OMT on 01/01/2023. Patient states continues to have some discomfort and pain noted.  Medications patient has been prescribed:   Taking:         Reviewed prior external information including notes and imaging from previsou exam, outside providers and external EMR if available.   As well as notes that were available from care everywhere and other healthcare systems.  Past medical history, social, surgical and family history all reviewed in electronic medical record.  No pertanent information unless stated regarding to the chief complaint.   Past Medical History:  Diagnosis Date   Complication of anesthesia    History of vertigo    Hx of varicella    PONV (postoperative nausea and vomiting)     Allergies  Allergen Reactions   Lactose Intolerance (Gi) Other (See Comments)    Gas, stomach cramps     Review of Systems:  No headache, visual changes, nausea, vomiting, diarrhea, constipation, dizziness, abdominal pain, skin rash, fevers, chills, night sweats, weight loss, swollen lymph nodes, body aches, joint swelling, chest pain, shortness of breath, mood changes. POSITIVE muscle aches  Objective  Blood pressure 122/86, pulse (!) 50, height 5\' 6"  (1.676 m), SpO2 99 %, unknown if currently breastfeeding.   General: No apparent distress alert and oriented x3 mood and affect normal, dressed appropriately.  HEENT: Pupils equal, extraocular movements intact  Respiratory: Patient's speak in full sentences and does not appear short of breath  Cardiovascular: No lower extremity edema, non tender, no erythema   Upper back does have some tenderness to palpation more on the left side of the parascapular area.  Patient does have some tightness in the neck on the left side as well.  Limited muscular skeletal ultrasound was performed and interpreted by Hulan Saas, M  Limited ultrasound of patient's left shoulder shows that there is some mild hypoechoic changes that is consistent with bursitis of the subscapularis area.  Positive impingement with dynamic testing noted.  Acromioclavicular joint appears to be unremarkable.  Rotator cuff appears to be intact with no significant abnormality. Impression: Shoulder bursitis  Osteopathic findings  T3 extended rotated and side bent left inhaled rib T7 extended rotated and side bent left L1 flexed rotated and side bent right Sacrum right on right       Assessment and Plan:  SI (sacroiliac) joint dysfunction Chronic, symptoms consideration. Vertigo that is likely contributing.  Discussed with patient about icing regimen and home exercises, discussed which activities to do and which ones to avoid  Increase activity slowly.  Follow-up again in 6 to 8 weeks of the liver  Benign paroxysmal positional vertigo Has had difficulty with previously, has seen neurology.  If worsening symptoms do need to consider advanced imaging.  Acute bursitis of left shoulder First time we have discussed with patient, patient does feel like there was an injury about 8 months ago.  Discussed with patient that this is more of an impingement of the shoulder.  Does have some limited range of motion so differential includes the possibility of early frozen shoulder but once again secondary to the  injury likely not.  Patient given home exercises with athletic trainer.  Avoided the neck exercises secondary to the discomfort and pain.  Follow-up again in 7 to 8 weeks    Nonallopathic problems  Decision today to treat with OMT was based on Physical Exam  After verbal consent patient  was treated with HVLA, ME, FPR techniques in  thoracic, lumbar, and sacral  areas  Patient tolerated the procedure well with improvement in symptoms  Patient given exercises, stretches and lifestyle modifications  See medications in patient instructions if given  Patient will follow up in 4-8 weeks     The above documentation has been reviewed and is accurate and complete Kirsten Pulley, DO         Note: This dictation was prepared with Dragon dictation along with smaller phrase technology. Any transcriptional errors that result from this process are unintentional.

## 2022-03-24 ENCOUNTER — Encounter: Payer: Self-pay | Admitting: Physical Therapy

## 2022-03-24 ENCOUNTER — Ambulatory Visit (INDEPENDENT_AMBULATORY_CARE_PROVIDER_SITE_OTHER): Payer: BC Managed Care – PPO | Admitting: Family Medicine

## 2022-03-24 ENCOUNTER — Encounter: Payer: Self-pay | Admitting: Family Medicine

## 2022-03-24 ENCOUNTER — Other Ambulatory Visit: Payer: Self-pay

## 2022-03-24 VITALS — BP 122/86 | HR 50 | Ht 66.0 in

## 2022-03-24 DIAGNOSIS — M533 Sacrococcygeal disorders, not elsewhere classified: Secondary | ICD-10-CM

## 2022-03-24 DIAGNOSIS — M25512 Pain in left shoulder: Secondary | ICD-10-CM

## 2022-03-24 DIAGNOSIS — G8929 Other chronic pain: Secondary | ICD-10-CM | POA: Diagnosis not present

## 2022-03-24 DIAGNOSIS — M9902 Segmental and somatic dysfunction of thoracic region: Secondary | ICD-10-CM | POA: Diagnosis not present

## 2022-03-24 DIAGNOSIS — M9904 Segmental and somatic dysfunction of sacral region: Secondary | ICD-10-CM

## 2022-03-24 DIAGNOSIS — M9903 Segmental and somatic dysfunction of lumbar region: Secondary | ICD-10-CM

## 2022-03-24 DIAGNOSIS — H8110 Benign paroxysmal vertigo, unspecified ear: Secondary | ICD-10-CM

## 2022-03-24 DIAGNOSIS — M7552 Bursitis of left shoulder: Secondary | ICD-10-CM | POA: Diagnosis not present

## 2022-03-24 DIAGNOSIS — R42 Dizziness and giddiness: Secondary | ICD-10-CM

## 2022-03-24 MED ORDER — PREDNISONE 20 MG PO TABS: 40.0000 mg | ORAL_TABLET | Freq: Every day | ORAL | 0 refills | Status: DC

## 2022-03-24 NOTE — Patient Instructions (Signed)
Prednisone 40mg  5 days Do prescribed exercises at least 3x a week  See you again in 7-8 weeks

## 2022-03-24 NOTE — Assessment & Plan Note (Signed)
Chronic, symptoms consideration. Vertigo that is likely contributing.  Discussed with patient about icing regimen and home exercises, discussed which activities to do and which ones to avoid  Increase activity slowly.  Follow-up again in 6 to 8 weeks of the liver

## 2022-03-24 NOTE — Assessment & Plan Note (Signed)
First time we have discussed with patient, patient does feel like there was an injury about 8 months ago.  Discussed with patient that this is more of an impingement of the shoulder.  Does have some limited range of motion so differential includes the possibility of early frozen shoulder but once again secondary to the injury likely not.  Patient given home exercises with athletic trainer.  Avoided the neck exercises secondary to the discomfort and pain.  Follow-up again in 7 to 8 weeks

## 2022-03-24 NOTE — Assessment & Plan Note (Signed)
Has had difficulty with previously, has seen neurology.  If worsening symptoms do need to consider advanced imaging.

## 2022-03-29 ENCOUNTER — Encounter: Payer: Self-pay | Admitting: Physical Therapy

## 2022-03-29 ENCOUNTER — Ambulatory Visit: Payer: BC Managed Care – PPO | Attending: Family Medicine | Admitting: Physical Therapy

## 2022-03-29 VITALS — BP 132/95 | HR 85

## 2022-03-29 DIAGNOSIS — R42 Dizziness and giddiness: Secondary | ICD-10-CM | POA: Diagnosis not present

## 2022-03-29 NOTE — Therapy (Unsigned)
OUTPATIENT PHYSICAL THERAPY VESTIBULAR EVALUATION   Patient Name: Kirsten Hunt MRN: [id number] DOB:1985-09-09, 37 y.o., female Today's Date: 03/30/2022  END OF SESSION:  PT End of Session - 03/29/22 1018     Visit Number 1    Number of Visits 9   including eval   Date for PT Re-Evaluation 05/10/22    Authorization Type [employer]    Progress Note Due on Visit 10    PT Start Time 1016    PT Stop Time 1100    PT Time Calculation (min) 44 min    Equipment Utilized During Treatment Other (comment)   performed at seated level for today's session; gait belt not indicated, recommend for higher level balance activities   Activity Tolerance Patient tolerated treatment well    Behavior During Therapy WFL for tasks assessed/performed             Past Medical History:  Diagnosis Date   Complication of anesthesia    History of vertigo    Hx of varicella    PONV (postoperative nausea and vomiting)    Past Surgical History:  Procedure Laterality Date   CESAREAN SECTION N/A 02/01/2020   Procedure: PRIMARY CESAREAN SECTION EDC: 03-07-20  ALLERGIES: NKDA;  Surgeon: Linda Hedges, DO;  Location: [facility]S;  Service: Obstetrics;  Laterality: N/A;   WISDOM TOOTH EXTRACTION     Patient Active Problem List   Diagnosis Date Noted   Benign paroxysmal positional vertigo 03/24/2022   Acute bursitis of left shoulder 03/24/2022   Strain of calf muscle, initial encounter 09/16/2021   Dyslipidemia 12/01/2020   SI (sacroiliac) joint dysfunction 09/05/2018   Nonallopathic lesion of sacral region 09/05/2018   Nonallopathic lesion of lumbosacral region 09/05/2018    PCP: Dimas Chyle, MD REFERRING PROVIDER: Lyndal Pulley, DO  REFERRING DIAG: R42 (ICD-10-CM) - Vertigo  THERAPY DIAG:  Dizziness and giddiness - Plan: PT plan of care cert/re-cert  ONSET DATE: [id number] (referral date)  Rationale for Evaluation and Treatment: Rehabilitation  SUBJECTIVE:   SUBJECTIVE  STATEMENT: Patient reports that she has gotten new spells of dizziness/vertigo around 1 month ago when she goes to lay on her back and one time with yoga. Patient is unsure if she had any direction preference. She was recently seen at this clinic for possible BPPV in November 2023 but dizziness had resolved at that time and so was not seen beyond eval. Patient was taught how to do Epley maneuver at home but does not feel comfortable doing so today. Patient reports that symptoms last approximately a second or two and resolve when she switched positions. She typically rolls onto R side to get symptoms to stop. Patient was started on predinose for shoulder but is holding off taking it until she clears BPPV.   Pt accompanied by: self  PERTINENT HISTORY: L shoulder bursitis, 2 prior episodes of vertigo ~6 years ago and again in November  PAIN:  Are you having pain? No  PRECAUTIONS: Fall  WEIGHT BEARING RESTRICTIONS: No  FALLS: Has patient fallen in last 6 months? 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  PATIENT GOALS: "Get dizziness to go away and be able to sleep/workout with out worry."  OBJECTIVE:   DIAGNOSTIC FINDINGS: None  COGNITION: Overall cognitive status: Within functional limits for tasks assessed  SENSATION: WFL  EDEMA:  WFL  POSTURE:  forward head  Cervical ROM:    Active A/PROM (deg) eval  Flexion WNL  Extension WNL  Right lateral flexion WNL  Left lateral flexion WNL  Right rotation WNL  Left rotation WNL  (Blank rows = not tested) GAIT: Gait pattern: WFL Distance walked: 2 x 80 feet Assistive device utilized: none Level of assistance: Modified independence Comments: No obvious signs of unsteadiness noted  during today's session  PATIENT SURVEYS:  FOTO 64  VESTIBULAR ASSESSMENT:  GENERAL OBSERVATION: moving well, no major signs of dizziness   SYMPTOM BEHAVIOR:  Subjective history: see above, started approximately 1 month ago  Non-Vestibular symptoms: nausea/vomiting Type of dizziness: Imbalance (Disequilibrium), Spinning/Vertigo, Unsteady with head/body turns, and "World moves"  Frequency: 1x a week due to avoid positions that trigger  Duration: a few seconds Aggravating factors: Induced by position change: lying supine, Induced by motion: performing yoga, and Worse in the dark  Relieving factors:  rolling right side, sitting still, sitting up slow  Progression of symptoms: unchanged  OCULOMOTOR EXAM:  Ocular Alignment: normal  Ocular ROM: No Limitations  Spontaneous Nystagmus: absent  Gaze-Induced Nystagmus: absent  Smooth Pursuits: intact  Saccades: intact  Convergence/Divergence: 8 cm   VESTIBULAR - OCULAR REFLEX:   Slow VOR: Normal  VOR Cancellation: Normal  Head-Impulse Test: HIT Right: negative HIT Left: negative  Dynamic Visual Acuity: To be assessed   POSITIONAL TESTING: Assessed BP readings x 3 times at start and with rest into session, patient reports increased anxiety likely indicated BP, held positional testing today but will follow up as able  Vitals:   03/29/22 1035  BP: (!) 132/95  Pulse: 85     MOTION SENSITIVITY:  Motion Sensitivity Quotient Intensity: 0 = none, 1 = Lightheaded, 2 = Mild, 3 = Moderate, 4 = Severe, 5 = Vomiting  Intensity  1. Sitting to supine TBA  2. Supine to L side TBA  3. Supine to R side TBA  4. Supine to sitting TBA  5. L Hallpike-Dix TBA  6. Up from L  TBA  7. R Hallpike-Dix TBA  8. Up from R  TBA  9. Sitting, head tipped to L knee 0  10. Head up from L knee 0  11. Sitting, head tipped to R knee 0  12. Head up from R knee 0  13. Sitting head turns x5 0  14.Sitting head nods x5 0  15. In stance, 180 turn to L  0  16.  In stance, 180 turn to R 0    Held positional testing due to elevated BP readings, further follow up indicated to assess and will treat as indicated   VESTIBULAR TREATMENT:                                                                                                    Initial Eval to be assessed and goal written as indicated  PATIENT EDUCATION: Education details: POC, examination findings, results, BP readings Person educated: Patient Education  method: Explanation Education comprehension: verbalized understanding and needs further education  HOME EXERCISE PROGRAM:  To be provided as indicated  GOALS: Goals reviewed with patient? Yes  LONG TERM GOALS: Target date: 05/10/2022 (STG = LTG due to POC length)  Patient will report demonstrate independence with final HEP in order to maintain current gains and continue to progress after physical therapy discharge.   Baseline: To be provided as indicated Goal status: INITIAL  2.  Patient will tolerate the Dix-Hallpike Maneuver bilaterally without reports of dizziness or spinning to indicate resolution of BPPV in order for patient to return to activities of daily living.   Baseline: To be assessed Goal status: INITIAL  3.  Patient will improve FOTO score to 66 to achieve predicted improvements in functional mobility due to skilled physical therapy interventions to increase safety with and participation in daily activities.  Baseline: 64 Goal status: INITIAL  4.  SOT to be assessed and goal written as indicated Baseline: To be assessed Goal status: INITIAL  5.  Finish assessing MSQ and write goal as indicated Baseline: To be assessed Goal status: INITIAL  ASSESSMENT:  CLINICAL IMPRESSION: Patient is a 37 y.o. female who was seen today for physical therapy evaluation and treatment for new onset of vertigo subjectively consistent with possible BPPV. Due to elevated BP throughout session unable to test positional testing but  performed remainder of objective exam. Only notably findings included convergence > 10 cm; and no other central or peripheral symptoms found at this time. Pending BP will assess for positional vertigo and treat as indicated. Patient will benefit from further PT assessment to rule out possibility for BPPV and treat as indicated to return to PLOF.  OBJECTIVE IMPAIRMENTS: decreased balance and dizziness.   ACTIVITY LIMITATIONS: sleeping and yoga  PARTICIPATION LIMITATIONS: driving and community activity  PERSONAL FACTORS: Past/current experiences and 1 comorbidity: see above  are also affecting patient's functional outcome.   REHAB POTENTIAL: Good  CLINICAL DECISION MAKING: Stable/uncomplicated  EVALUATION COMPLEXITY: Low   PLAN:  PT FREQUENCY: 1-2x/week  PT DURATION: 4 weeks  PLANNED INTERVENTIONS: Therapeutic exercises, Therapeutic activity, Neuromuscular re-education, Balance training, Gait training, Patient/Family education, Self Care, Joint mobilization, Vestibular training, Canalith repositioning, Manual therapy, and Re-evaluation  PLAN FOR NEXT SESSION: positional testing and treat as indicated, SOT, DVA   Esperanza Heir, PT, DPT 03/30/2022, 8:29 AM

## 2022-03-30 ENCOUNTER — Ambulatory Visit: Payer: BC Managed Care – PPO | Admitting: Physical Therapy

## 2022-03-30 ENCOUNTER — Encounter: Payer: Self-pay | Admitting: Physical Therapy

## 2022-03-30 VITALS — BP 141/90 | HR 106

## 2022-03-30 DIAGNOSIS — R42 Dizziness and giddiness: Secondary | ICD-10-CM

## 2022-03-30 NOTE — Therapy (Signed)
OUTPATIENT PHYSICAL THERAPY VESTIBULAR TREATMENT   Patient Name: Kirsten Hunt MRN: [id number] DOB:July 05, 1985, 37 y.o., female Today's Date: 03/30/2022  END OF SESSION:  PT End of Session - 03/30/22 0834     Visit Number 2    Number of Visits 9    Date for PT Re-Evaluation 05/10/22    Authorization Type [employer]    Progress Note Due on Visit 10    PT Start Time 0830    PT Stop Time 0910    PT Time Calculation (min) 40 min    Equipment Utilized During Treatment Other (comment)   performed at mat level - gait belt not indicated   Activity Tolerance Patient tolerated treatment well    Behavior During Therapy WFL for tasks assessed/performed             Past Medical History:  Diagnosis Date   Complication of anesthesia    History of vertigo    Hx of varicella    PONV (postoperative nausea and vomiting)    Past Surgical History:  Procedure Laterality Date   CESAREAN SECTION N/A 02/01/2020   Procedure: PRIMARY CESAREAN SECTION EDC: 03-07-20  ALLERGIES: NKDA;  Surgeon: Linda Hedges, DO;  Location: [facility] LD [facility];  Service: Obstetrics;  Laterality: N/A;   WISDOM TOOTH EXTRACTION     Patient Active Problem List   Diagnosis Date Noted   Benign paroxysmal positional vertigo 03/24/2022   Acute bursitis of left shoulder 03/24/2022   Strain of calf muscle, initial encounter 09/16/2021   Dyslipidemia 12/01/2020   SI (sacroiliac) joint dysfunction 09/05/2018   Nonallopathic lesion of sacral region 09/05/2018   Nonallopathic lesion of lumbosacral region 09/05/2018    PCP: Dimas Chyle, MD REFERRING PROVIDER: Lyndal Pulley, DO  REFERRING DIAG: R42 (ICD-10-CM) - Vertigo  THERAPY DIAG:  Dizziness and giddiness  ONSET DATE: 03/24/2022 (referral date)  Rationale for Evaluation and Treatment: Rehabilitation  SUBJECTIVE:   SUBJECTIVE STATEMENT: Patient reports no falls/near falls. Reports one brief episode of dizziness when laying back in bed last night. Reports no  symptoms at baseline.  Pt accompanied by: self  PERTINENT HISTORY: L shoulder bursitis, 2 prior episodes of vertigo ~6 years ago and again in November  PAIN:  Are you having pain? No  PRECAUTIONS: Fall  WEIGHT BEARING RESTRICTIONS: No  FALLS: Has patient fallen in last 6 months? 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  PATIENT GOALS: "Get dizziness to go away and be able to sleep/workout with out worry."  OBJECTIVE:   DIAGNOSTIC FINDINGS: None  COGNITION: Overall cognitive status: Within functional limits for tasks assessed  SENSATION: WFL  EDEMA:  WFL  POSTURE:  forward head  Cervical ROM:    Active A/PROM (deg) eval  Flexion WNL  Extension WNL  Right lateral flexion WNL  Left lateral flexion WNL  Right rotation WNL  Left rotation WNL  (Blank rows = not tested) GAIT: Gait pattern: WFL Distance walked: 2 x 80 feet Assistive device utilized: none Level of assistance: Modified independence Comments: No obvious signs of unsteadiness noted during today's session  PATIENT SURVEYS:  FOTO 64  VESTIBULAR ASSESSMENT:  GENERAL OBSERVATION: moving well, no major signs of dizziness, some apprehension    POSITIONAL TESTING:  Roll Test (  L & R): negative, no symptoms/nystagmus L Dix Hallpike: negative, no nystagmus R Dix Hallpike: positive, R torsional upbeating nystagmus, ~15" duration and 5" latency  Vitals:   03/30/22 0838  BP: (!) 141/90  Pulse: (!) 106    VESTIBULAR TREATMENT:                                                                                                    Canalith repositioning maneuver: R Epley for posterior canalithiasis x 3 - Reported symptoms consistently in position 1 with duration  between 15-8" and latency between 5-10" decreasing intensity each rep until resolution of symptoms on final assessment  Patient had been taught Epley previously but requested refresher so utlized teach back method during treatment; patient independent by final rep    PATIENT EDUCATION: Education details: POC, Epley maneuver  Person educated: Patient Education method: Explanation Education comprehension: verbalized understanding and needs further education  HOME EXERCISE PROGRAM:  To be provided as indicated  GOALS: Goals reviewed with patient? Yes  LONG TERM GOALS: Target date: 05/10/2022 (STG = LTG due to POC length)  Patient will report demonstrate independence with final HEP in order to maintain current gains and continue to progress after physical therapy discharge.   Baseline: To be provided as indicated Goal status: INITIAL  2.  Patient will tolerate the Dix-Hallpike Maneuver bilaterally without reports of dizziness or spinning to indicate resolution of BPPV in order for patient to return to activities of daily living.   Baseline: Positive R Dix-Hallpike maneuver Goal status: INITIAL  3.  Patient will improve FOTO score to 66 to achieve predicted improvements in functional mobility due to skilled physical therapy interventions to increase safety with and participation in daily activities.  Baseline: 64 Goal status: INITIAL  4.  SOT to be assessed and goal written as indicated Baseline: To be assessed Goal status: INITIAL  5.  Finish assessing MSQ and write goal as indicated Baseline: To be assessed Goal status: INITIAL  ASSESSMENT:  CLINICAL IMPRESSION: Session emphasized treated for R posterior canal BPPV. Patient presents with R torsional upbeating nystagmus in [facility] with duration < 20" indicative of R posterior canalithiasis. Patient treated with R Epley maneuver x 3 times and full resolution of symptoms noted. Refreshed patient on how to perform maneuver at  home. Scheduled 1x more visit for further assessment/treatment as indicated. Continue POC.  OBJECTIVE IMPAIRMENTS: decreased balance and dizziness.   ACTIVITY LIMITATIONS: sleeping and yoga  PARTICIPATION LIMITATIONS: driving and community activity  PERSONAL FACTORS: Past/current experiences and 1 comorbidity: see above  are also affecting patient's functional outcome.   REHAB POTENTIAL: Good  CLINICAL DECISION MAKING: Stable/uncomplicated  EVALUATION COMPLEXITY: Low   PLAN:  PT FREQUENCY: 1-2x/week  PT DURATION: 4 weeks  PLANNED INTERVENTIONS: Therapeutic exercises, Therapeutic activity, Neuromuscular re-education, Balance training, Gait training, Patient/Family education, Self Care, Joint mobilization, Vestibular training, Canalith repositioning, Manual therapy, and Re-evaluation  PLAN FOR NEXT SESSION: positional testing and treat as indicated, SOT, DVA if interested other plan for early D/C   Esperanza Heir, PT, DPT 03/30/2022, 9:25 AM

## 2022-04-05 ENCOUNTER — Ambulatory Visit: Payer: BC Managed Care – PPO | Admitting: Physical Therapy

## 2022-04-06 ENCOUNTER — Encounter: Payer: Self-pay | Admitting: Physical Therapy

## 2022-04-06 NOTE — Therapy (Signed)
[facility] [address] [place], [place], [address] Phone: [phone]   Fax:  [phone]  Patient Details  Name: Kirsten Hunt MRN: [id number] Date of Birth: 1985/10/28 Referring Provider:  Lyndal Pulley, DO  Encounter Date: 04/06/2022  PHYSICAL THERAPY DISCHARGE SUMMARY  Visits from Start of Care: 2  Current functional level related to goals / functional outcomes: Unable to formally test due to PT self D/C but reports full resolution of symptoms and improved FOTO score (emailed) 1 point above expected in fewer visits.   Remaining deficits: None - returned to [facility]   Education / Equipment: Return to therapy if experiences recurrence and unable to treat at home with Epley maneuver.    Patient agrees to discharge. Patient goals were  subjectively all met . Patient is being discharged due to being pleased with the current functional level. Patient is now fully back to PLOF and no additional skilled physical therapy services indicated at this time.  Esperanza Heir, PT, DPT 04/06/2022, 1:34 PM  [facility] [address] [place], [place], [address] Phone: [phone]   Fax:  [phone]

## 2022-05-13 NOTE — Progress Notes (Signed)
[facility] [address] [place] [address] Phone: [phone] Subjective:   Kirsten Hunt, am serving as a scribe for Dr. Antoine Primas.  I'm seeing this patient by the request  of:  Ardith Dark, MD  CC: Back and neck pain follow-up  [id number]  Kirsten Hunt is a 37 y.o. female coming in with complaint of back and neck pain. OTM 03/24/2022. Also f/u for L shoulder pain. Patient states here for manipulation. Would like for you to take a look at R knee. Felt something on Saturday, but not painful today. No other concerns.  Medications patient has been prescribed: Prednisone  Taking:         Reviewed prior external information including notes and imaging from previsou exam, outside providers and external EMR if available.   As well as notes that were available from care everywhere and other healthcare systems.  Past medical history, social, surgical and family history all reviewed in electronic medical record.  No pertanent information unless stated regarding to the chief complaint.   Past Medical History:  Diagnosis Date   Complication of anesthesia    History of vertigo    Hx of varicella    PONV (postoperative nausea and vomiting)     Allergies  Allergen Reactions   Lactose Intolerance (Gi) Other (See Comments)    Gas, stomach cramps     Review of Systems:  No headache, visual changes, nausea, vomiting, diarrhea, constipation, dizziness, abdominal pain, skin rash, fevers, chills, night sweats, weight loss, swollen lymph nodes, body aches, joint swelling, chest pain, shortness of breath, mood changes. POSITIVE muscle aches  Objective  Blood pressure 120/76, pulse 97, height 5\' 6"  (1.676 m), weight 160 lb (72.6 kg), SpO2 (!) 70 %, unknown if currently breastfeeding.   General: No apparent distress alert and oriented x3 mood and affect normal, dressed appropriately.  HEENT: Pupils equal, extraocular movements intact   Respiratory: Patient's speak in full sentences and does not appear short of breath  Cardiovascular: No lower extremity edema, non tender, no erythema  Low back exam does have some loss of lordosis noted.  Tightness around the right sacroiliac joint.  Patient's neck still tight but did not want to cause any of the vertical symptoms patient was having previously.  So deferred most of the exam.   Osteopathic findings   T3 extended rotated and side bent right inhaled rib T9 extended rotated and side bent left L2 flexed rotated and side bent right Sacrum right on right     Assessment and Plan:  SI (sacroiliac) joint dysfunction Sacroiliac dysfunction.  Patient has been running on a more regular function at this moment.  Discussed icing regimen and home exercises, discussed which activities to do and which ones to avoid.  Increase activity slowly otherwise.  Follow-up with me again in 6 to 8 weeks no changes in medications at this time.    Nonallopathic problems  Decision today to treat with OMT was based on Physical Exam  After verbal consent patient was treated with HVLA, ME, FPR techniques in cervical, rib, thoracic, lumbar, and sacral  areas  Patient tolerated the procedure well with improvement in symptoms  Patient given exercises, stretches and lifestyle modifications  See medications in patient instructions if given  Patient will follow up in 4-8 weeks      The above documentation has been reviewed and is accurate and complete Judi Saa, DO        Note:  This dictation was prepared with Dragon dictation along with smaller phrase technology. Any transcriptional errors that result from this process are unintentional.

## 2022-05-18 ENCOUNTER — Ambulatory Visit (INDEPENDENT_AMBULATORY_CARE_PROVIDER_SITE_OTHER): Payer: BC Managed Care – PPO | Admitting: Family Medicine

## 2022-05-18 VITALS — BP 120/76 | HR 97 | Ht 66.0 in | Wt 160.0 lb

## 2022-05-18 DIAGNOSIS — M9908 Segmental and somatic dysfunction of rib cage: Secondary | ICD-10-CM

## 2022-05-18 DIAGNOSIS — M222X1 Patellofemoral disorders, right knee: Secondary | ICD-10-CM | POA: Diagnosis not present

## 2022-05-18 DIAGNOSIS — Z131 Encounter for screening for diabetes mellitus: Secondary | ICD-10-CM | POA: Diagnosis not present

## 2022-05-18 DIAGNOSIS — M533 Sacrococcygeal disorders, not elsewhere classified: Secondary | ICD-10-CM | POA: Diagnosis not present

## 2022-05-18 DIAGNOSIS — Z6824 Body mass index (BMI) 24.0-24.9, adult: Secondary | ICD-10-CM | POA: Diagnosis not present

## 2022-05-18 DIAGNOSIS — Z1329 Encounter for screening for other suspected endocrine disorder: Secondary | ICD-10-CM | POA: Diagnosis not present

## 2022-05-18 DIAGNOSIS — M9904 Segmental and somatic dysfunction of sacral region: Secondary | ICD-10-CM | POA: Diagnosis not present

## 2022-05-18 DIAGNOSIS — M9902 Segmental and somatic dysfunction of thoracic region: Secondary | ICD-10-CM | POA: Diagnosis not present

## 2022-05-18 DIAGNOSIS — M9903 Segmental and somatic dysfunction of lumbar region: Secondary | ICD-10-CM

## 2022-05-18 DIAGNOSIS — M9901 Segmental and somatic dysfunction of cervical region: Secondary | ICD-10-CM

## 2022-05-18 DIAGNOSIS — Z13220 Encounter for screening for lipoid disorders: Secondary | ICD-10-CM | POA: Diagnosis not present

## 2022-05-18 DIAGNOSIS — Z13228 Encounter for screening for other metabolic disorders: Secondary | ICD-10-CM | POA: Diagnosis not present

## 2022-05-18 DIAGNOSIS — Z01419 Encounter for gynecological examination (general) (routine) without abnormal findings: Secondary | ICD-10-CM | POA: Diagnosis not present

## 2022-05-18 NOTE — Assessment & Plan Note (Signed)
New problem, mild overall.  Discussed with patient about different shoe choices that I think will be beneficial.  Discussed home exercises and icing regimen.  Increase activity slowly.  Follow-up again in 6 to 8 weeks.

## 2022-05-18 NOTE — Assessment & Plan Note (Signed)
Sacroiliac dysfunction.  Patient has been running on a more regular function at this moment.  Discussed icing regimen and home exercises, discussed which activities to do and which ones to avoid.  Increase activity slowly otherwise.  Follow-up with me again in 6 to 8 weeks no changes in medications at this time.

## 2022-05-18 NOTE — Patient Instructions (Signed)
Thigh compression Body Helix [place] or [place] carbon x See me again in 5-6 weeks

## 2022-06-21 ENCOUNTER — Ambulatory Visit (INDEPENDENT_AMBULATORY_CARE_PROVIDER_SITE_OTHER): Payer: BC Managed Care – PPO | Admitting: Sports Medicine

## 2022-06-21 ENCOUNTER — Ambulatory Visit: Payer: Self-pay

## 2022-06-21 VITALS — BP 108/78 | HR 88 | Ht 66.0 in | Wt 160.0 lb

## 2022-06-21 DIAGNOSIS — M7631 Iliotibial band syndrome, right leg: Secondary | ICD-10-CM

## 2022-06-21 DIAGNOSIS — M722 Plantar fascial fibromatosis: Secondary | ICD-10-CM | POA: Diagnosis not present

## 2022-06-21 MED ORDER — MELOXICAM 15 MG PO TABS: 15.0000 mg | ORAL_TABLET | Freq: Every day | ORAL | 0 refills | Status: AC

## 2022-06-21 NOTE — Patient Instructions (Signed)
-   Start meloxicam 15 mg daily x2 weeks.  If still having pain after 2 weeks, complete 3rd-week of meloxicam. May use remaining meloxicam as needed once daily for pain control.  Do not to use additional NSAIDs while taking meloxicam.  May use Tylenol [phone] mg 2 to 3 times a day for breakthrough pain. IT band hep  Plantar fascitis  Core  Gradual re- intro to running  3-4 week follow up

## 2022-06-21 NOTE — Progress Notes (Signed)
[name] [address] [place] [address] Phone: [phone]   Assessment and Plan:     1. It band syndrome, right -Acute, initial sports medicine visit - Most consistent with IT band syndrome, primarily distal.  Likely flared due to patient's physical activity running as well as a gait with abduction to compensate for ankle supination - Start HEP for IT band, plantar fasciitis, core - Gradual reintroduction into running.  Recommend starting at 1 mile, waiting 24 hours to see if pain flares, then increasing to 1 mile at a time - Start meloxicam 15 mg daily x2 weeks.  If still having pain after 2 weeks, complete 3rd-week of meloxicam. May use remaining meloxicam as needed once daily for pain control.  Do not to use additional NSAIDs while taking meloxicam.  May use Tylenol [phone] mg 2 to 3 times a day for breakthrough pain.  2. Plantar fasciitis, left -Acute, initial sports medicine visit - Consistent with plantar fasciitis of left foot based on HPI and physical exam - Start HEP for IT band, plantar fasciitis, core - Gradual reintroduction into running.  Recommend starting at 1 mile, waiting 24 hours to see if pain flares, then increasing to 1 mile at a time - Start meloxicam 15 mg daily x2 weeks.  If still having pain after 2 weeks, complete 3rd-week of meloxicam. May use remaining meloxicam as needed once daily for pain control.  Do not to use additional NSAIDs while taking meloxicam.  May use Tylenol [phone] mg 2 to 3 times a day for breakthrough pain.  Other orders - meloxicam (MOBIC) 15 MG tablet; Take 1 tablet (15 mg total) by mouth daily.    Pertinent previous records reviewed include none   Follow Up: Patient has follow-up with Dr. Katrinka Blazing in 2 weeks.  Recommend keeping that appointment.  If no improvement, may follow-up with me in 3 to 4 weeks to discuss distal IT band CSI versus plantar fascial CSI versus physical  therapy   Subjective:   I, Kirsten Hunt, am serving as a Neurosurgeon for Doctor Richardean Sale  Chief Complaint: IT band pain   HPI:   06/21/22 Patient is a 37 year old female complaining of IT band pain. Patient states that she did a 10 mile run may 11th, had pain up and down the lateral leg IT band hamstring/ quad area.  5k saturday she felt the leg tighten and get sore during and after . She has a half marathon in October that she needs to train for and wants to know what's going on. No meds for the pain the pain resolves pretty quickly after icing . Thinks she has plantar fascitis on left leg . Less than 20 miles on her shoes no inserts. Is running same terrain, only runs in the street when she has to,  is stretching when she is done   Relevant Historical Information: None pertinent  Additional pertinent review of systems negative.   Current Outpatient Medications:    meloxicam (MOBIC) 15 MG tablet, Take 1 tablet (15 mg total) by mouth daily., Disp: 30 tablet, Rfl: 0   cholecalciferol (VITAMIN D3) 25 MCG (1000 UNIT) tablet, Take 1,000 Units by mouth daily., Disp: , Rfl:    lactase (LACTAID) 3000 units tablet, Take 3,000 Units by mouth daily as needed (to prevent stomach cramps prior to a meal)., Disp: , Rfl:    meclizine (ANTIVERT) 25 MG tablet, Take 1 tablet (25 mg total) by  mouth 3 (three) times daily as needed for dizziness. (Patient not taking: Reported on 12/02/2021), Disp: 30 tablet, Rfl: 0   meloxicam (MOBIC) 15 MG tablet, Take 1 tablet (15 mg total) by mouth daily., Disp: 30 tablet, Rfl: 0   ondansetron (ZOFRAN) 4 MG tablet, Take 1 tablet (4 mg total) by mouth every 8 (eight) hours as needed for nausea or vomiting., Disp: 20 tablet, Rfl: 0   predniSONE (DELTASONE) 20 MG tablet, Take 2 tablets (40 mg total) by mouth daily with breakfast., Disp: 10 tablet, Rfl: 0   Probiotic Product (PROBIOTIC PO), Take 1 capsule by mouth daily., Disp: , Rfl:    Turmeric (QC TUMERIC COMPLEX PO),  Take by mouth., Disp: , Rfl:    Objective:     Vitals:   06/21/22 1459  BP: 108/78  Pulse: 88  SpO2: 100%  Weight: 160 lb (72.6 kg)  Height: 5\' 6"  (1.676 m)      Body mass index is 25.82 kg/m.    Physical Exam:    Gen: Appears well, nad, nontoxic and pleasant Psych: Alert and oriented, appropriate mood and affect Neuro: sensation intact, strength is 5/5 with df/pf/inv/ev, muscle tone wnl Skin: no susupicious lesions or rashes  Left foot/ankle:  No deformity, no swelling or effusion TTP proximal plantar fascial medial band and anterior medial calcaneus NTTP over fibular head, lat mal, medial mal, achilles, navicular, base of 5th, ATFL, CFL, deltoid, calcaneous or midfoot ROM DF 30, PF 45, inv/ev intact Negative ant drawer, talar tilt, rotation test, squeeze test. Neg thompson No pain with resisted inversion or eversion   General:  awake, alert oriented, no acute distress nontoxic Skin: no suspicious lesions or rashes Neuro:sensation intact and strength 5/5 with no deficits, no atrophy, normal muscle tone Psych: No signs of anxiety, depression or other mood disorder  Right leg/knee: No swelling No deformity Neg fluid wave, joint milking Knee ROM Flex 110, Ext 0 NTTP over the quad tendon, medial fem condyle, lat fem condyle, patella, plica, patella tendon, tibial tuberostiy, fibular head, posterior fossa, pes anserine bursa, gerdy's tubercle, medial jt line, lateral jt line, IT band, distal IT band crossing lateral knee  Gait normal   Electronically signed by:  Kirsten [name] 3:49 PM 06/21/22

## 2022-06-29 ENCOUNTER — Ambulatory Visit: Payer: BC Managed Care – PPO | Admitting: Family Medicine

## 2022-06-30 NOTE — Progress Notes (Signed)
[name] [address] [place] [address] Phone: [phone] Subjective:   Kirsten Hunt, am serving as a scribe for Dr. Antoine Primas.  I'm seeing this patient by the request  of:  Ardith Dark, MD  CC: Low back and neck pain follow-up  [id number]  Kirsten Hunt is a 37 y.o. female coming in with complaint of back and neck pain. OMT 05/18/2022. Saw Dr. Jean Rosenthal for ITB issue on 06/21/2022. Patient states that she is doing well.   Medications patient has been prescribed: None  Taking:         Reviewed prior external information including notes and imaging from previsou exam, outside providers and external EMR if available.   As well as notes that were available from care everywhere and other healthcare systems.  Past medical history, social, surgical and family history all reviewed in electronic medical record.  No pertanent information unless stated regarding to the chief complaint.   Past Medical History:  Diagnosis Date   Complication of anesthesia    History of vertigo    Hx of varicella    PONV (postoperative nausea and vomiting)     Allergies  Allergen Reactions   Lactose Intolerance (Gi) Other (See Comments)    Gas, stomach cramps     Review of Systems:  No headache, visual changes, nausea, vomiting, diarrhea, constipation, dizziness, abdominal pain, skin rash, fevers, chills, night sweats, weight loss, swollen lymph nodes, body aches, joint swelling, chest pain, shortness of breath, mood changes. POSITIVE muscle aches  Objective  Blood pressure 124/82, pulse (!) 106, height 5\' 6"  (1.676 m), weight 162 lb (73.5 kg), SpO2 98 %, unknown if currently breastfeeding.   General: No apparent distress alert and oriented x3 mood and affect normal, dressed appropriately.  HEENT: Pupils equal, extraocular movements intact  Respiratory: Patient's speak in full sentences and does not appear short of breath  Cardiovascular: No  lower extremity edema, non tender, no erythema  Does have weakness of the hip abductors noted.  Patient does have difficulty with FABER test right greater than left.  Patient does have tenderness over the right patellofemoral joint.  Does have some lateral tracking noted.  Osteopathic findings  C3 flexed rotated and side bent right C5 flexed rotated and side bent left T3 extended rotated and side bent right inhaled rib T9 extended rotated and side bent left L1 flexed rotated and side bent right Sacrum right on right       Assessment and Plan:  SI (sacroiliac) joint dysfunction Patient continues to have some discomfort.  Will continue to monitor.  Discussed posture and ergonomics.  We discussed hip abductor strengthening still with patient running.  Follow-up again in 6 to 8 weeks  Patellofemoral syndrome of right knee Still believe that this is secondary to her knee.  Given brace today.  Discussed home exercises.  Worsening pain consider injection.  Follow-up again in 6 to 8 weeks    Nonallopathic problems  Decision today to treat with OMT was based on Physical Exam  After verbal consent patient was treated with HVLA, ME, FPR techniques in cervical, rib, thoracic, lumbar, and sacral  areas  Patient tolerated the procedure well with improvement in symptoms  Patient given exercises, stretches and lifestyle modifications  See medications in patient instructions if given  Patient will follow up in 4-8 weeks     The above documentation has been reviewed and is accurate and complete Judi Saa, DO  Note: This dictation was prepared with Dragon dictation along with smaller phrase technology. Any transcriptional errors that result from this process are unintentional.

## 2022-07-01 ENCOUNTER — Ambulatory Visit: Payer: BC Managed Care – PPO | Admitting: Family Medicine

## 2022-07-01 ENCOUNTER — Encounter: Payer: Self-pay | Admitting: Family Medicine

## 2022-07-01 VITALS — BP 124/82 | HR 106 | Ht 66.0 in | Wt 162.0 lb

## 2022-07-01 DIAGNOSIS — M9901 Segmental and somatic dysfunction of cervical region: Secondary | ICD-10-CM | POA: Diagnosis not present

## 2022-07-01 DIAGNOSIS — M9902 Segmental and somatic dysfunction of thoracic region: Secondary | ICD-10-CM

## 2022-07-01 DIAGNOSIS — M533 Sacrococcygeal disorders, not elsewhere classified: Secondary | ICD-10-CM | POA: Diagnosis not present

## 2022-07-01 DIAGNOSIS — M222X1 Patellofemoral disorders, right knee: Secondary | ICD-10-CM | POA: Diagnosis not present

## 2022-07-01 DIAGNOSIS — M9908 Segmental and somatic dysfunction of rib cage: Secondary | ICD-10-CM

## 2022-07-01 DIAGNOSIS — M9904 Segmental and somatic dysfunction of sacral region: Secondary | ICD-10-CM | POA: Diagnosis not present

## 2022-07-01 DIAGNOSIS — M9903 Segmental and somatic dysfunction of lumbar region: Secondary | ICD-10-CM

## 2022-07-01 NOTE — Assessment & Plan Note (Signed)
Patient continues to have some discomfort.  Will continue to monitor.  Discussed posture and ergonomics.  We discussed hip abductor strengthening still with patient running.  Follow-up again in 6 to 8 weeks

## 2022-07-01 NOTE — Patient Instructions (Signed)
You have 14 days to return or exchange your brace Call [phone], then return the brace to our office Do prescribed exercises at least 3x a week Ice after running See you again in 7-8 weeks

## 2022-07-01 NOTE — Assessment & Plan Note (Signed)
Still believe that this is secondary to her knee.  Given brace today.  Discussed home exercises.  Worsening pain consider injection.  Follow-up again in 6 to 8 weeks

## 2022-07-05 ENCOUNTER — Encounter: Payer: Self-pay | Admitting: Family Medicine

## 2022-07-13 ENCOUNTER — Encounter: Payer: Self-pay | Admitting: Family Medicine

## 2022-08-25 NOTE — Progress Notes (Unsigned)
  [name] [address] [place] [address] Phone: [phone] Subjective:   Bruce Donath, am serving as a scribe for Dr. Antoine Primas.  I'm seeing this patient by the request  of:  Ardith Dark, MD  CC: Back and neck pain follow-up  [id number]  Kirsten Hunt is a 37 y.o. female coming in with complaint of back and neck pain. OMT 07/01/2022. Patient states that she has been doing well since last visit.   Medications patient has been prescribed: None           Reviewed prior external information including notes and imaging from previsou exam, outside providers and external EMR if available.   As well as notes that were available from care everywhere and other healthcare systems.  Past medical history, social, surgical and family history all reviewed in electronic medical record.  No pertanent information unless stated regarding to the chief complaint.   Past Medical History:  Diagnosis Date   Complication of anesthesia    History of vertigo    Hx of varicella    PONV (postoperative nausea and vomiting)     Allergies  Allergen Reactions   Lactose Intolerance (Gi) Other (See Comments)    Gas, stomach cramps     Review of Systems:  No headache, visual changes, nausea, vomiting, diarrhea, constipation, dizziness, abdominal pain, skin rash, fevers, chills, night sweats, weight loss, swollen lymph nodes, body aches, joint swelling, chest pain, shortness of breath, mood changes. POSITIVE muscle aches  Objective  Blood pressure 110/72, pulse 80, height 5\' 6"  (1.676 m), weight 162 lb (73.5 kg), SpO2 98%, unknown if currently breastfeeding.   General: No apparent distress alert and oriented x3 mood and affect normal, dressed appropriately.  HEENT: Pupils equal, extraocular movements intact  Respiratory: Patient's speak in full sentences and does not appear short of breath  Cardiovascular: No lower extremity edema, non tender, no  erythema  Gait relatively normal MSK:  Back does have some mild loss lordosis.  Mild tightness noted in the paraspinal musculature in the lumbar spine right greater than left.  Osteopathic findings  C2 flexed rotated and side bent right T3 extended rotated and side bent right inhaled rib T9 extended rotated and side bent left L2 flexed rotated and side bent right L5 flexed rotated and side bent left Sacrum right on right       Assessment and Plan:  SI (sacroiliac) joint dysfunction Patient is doing much better overall.  Still working on core strength.  Discussed icing regimen and home exercises.  Discussed patient will have a little more time when kids go back to school.  Follow-up with me again in 2 to 3 months.    Nonallopathic problems  Decision today to treat with OMT was based on Physical Exam  After verbal consent patient was treated with HVLA, ME, FPR techniques in cervical, rib, thoracic, lumbar, and sacral  areas  Patient tolerated the procedure well with improvement in symptoms  Patient given exercises, stretches and lifestyle modifications  See medications in patient instructions if given  Patient will follow up in 4-8 weeks             Note: This dictation was prepared with Dragon dictation along with smaller phrase technology. Any transcriptional errors that result from this process are unintentional.

## 2022-08-26 ENCOUNTER — Encounter: Payer: Self-pay | Admitting: Family Medicine

## 2022-08-26 ENCOUNTER — Ambulatory Visit (INDEPENDENT_AMBULATORY_CARE_PROVIDER_SITE_OTHER): Payer: BC Managed Care – PPO | Admitting: Family Medicine

## 2022-08-26 VITALS — BP 110/72 | HR 80 | Ht 66.0 in | Wt 162.0 lb

## 2022-08-26 DIAGNOSIS — M9901 Segmental and somatic dysfunction of cervical region: Secondary | ICD-10-CM

## 2022-08-26 DIAGNOSIS — M9904 Segmental and somatic dysfunction of sacral region: Secondary | ICD-10-CM

## 2022-08-26 DIAGNOSIS — M9902 Segmental and somatic dysfunction of thoracic region: Secondary | ICD-10-CM | POA: Diagnosis not present

## 2022-08-26 DIAGNOSIS — M9903 Segmental and somatic dysfunction of lumbar region: Secondary | ICD-10-CM | POA: Diagnosis not present

## 2022-08-26 DIAGNOSIS — M9908 Segmental and somatic dysfunction of rib cage: Secondary | ICD-10-CM | POA: Diagnosis not present

## 2022-08-26 DIAGNOSIS — M533 Sacrococcygeal disorders, not elsewhere classified: Secondary | ICD-10-CM

## 2022-08-26 NOTE — Assessment & Plan Note (Signed)
Patient is doing much better overall.  Still working on core strength.  Discussed icing regimen and home exercises.  Discussed patient will have a little more time when kids go back to school.  Follow-up with me again in 2 to 3 months.

## 2022-09-09 ENCOUNTER — Ambulatory Visit: Payer: BC Managed Care – PPO | Admitting: Family Medicine

## 2022-09-15 DIAGNOSIS — M79669 Pain in unspecified lower leg: Secondary | ICD-10-CM | POA: Diagnosis not present

## 2022-09-23 DIAGNOSIS — M79669 Pain in unspecified lower leg: Secondary | ICD-10-CM | POA: Diagnosis not present

## 2022-09-28 DIAGNOSIS — M79669 Pain in unspecified lower leg: Secondary | ICD-10-CM | POA: Diagnosis not present

## 2022-10-07 DIAGNOSIS — M79669 Pain in unspecified lower leg: Secondary | ICD-10-CM | POA: Diagnosis not present

## 2022-10-12 DIAGNOSIS — M79669 Pain in unspecified lower leg: Secondary | ICD-10-CM | POA: Diagnosis not present

## 2022-10-20 DIAGNOSIS — M79669 Pain in unspecified lower leg: Secondary | ICD-10-CM | POA: Diagnosis not present

## 2022-10-24 DIAGNOSIS — M79669 Pain in unspecified lower leg: Secondary | ICD-10-CM | POA: Diagnosis not present

## 2022-10-28 DIAGNOSIS — M79669 Pain in unspecified lower leg: Secondary | ICD-10-CM | POA: Diagnosis not present

## 2022-11-03 NOTE — Progress Notes (Unsigned)
  [name] [address] [place] [address] Phone: [phone] Subjective:   Kirsten Hunt, am serving as a scribe for Dr. Antoine Primas.  I'm seeing this patient by the request  of:  Ardith Dark, MD  CC: Back and neck pain follow-up  [id number]  Katleen Nesheim is a 37 y.o. female coming in with complaint of back and neck pain. OMT 08/26/2022. Patient states   Medications patient has been prescribed: None  Taking:         Reviewed prior external information including notes and imaging from previsou exam, outside providers and external EMR if available.   As well as notes that were available from care everywhere and other healthcare systems.  Past medical history, social, surgical and family history all reviewed in electronic medical record.  No pertanent information unless stated regarding to the chief complaint.   Past Medical History:  Diagnosis Date   Complication of anesthesia    History of vertigo    Hx of varicella    PONV (postoperative nausea and vomiting)     Allergies  Allergen Reactions   Lactose Intolerance (Gi) Other (See Comments)    Gas, stomach cramps     Review of Systems:  No headache, visual changes, nausea, vomiting, diarrhea, constipation, dizziness, abdominal pain, skin rash, fevers, chills, night sweats, weight loss, swollen lymph nodes, body aches, joint swelling, chest pain, shortness of breath, mood changes. POSITIVE muscle aches  Objective  Blood pressure 124/84, height 5\' 6"  (1.676 m), weight 162 lb (73.5 kg), SpO2 97%, unknown if currently breastfeeding.   General: No apparent distress alert and oriented x3 mood and affect normal, dressed appropriately.  HEENT: Pupils equal, extraocular movements intact  Respiratory: Patient's speak in full sentences and does not appear short of breath  Cardiovascular: No lower extremity edema, non tender, no erythema  Back exam does have some loss of  lordosis noted.  Some tenderness to palpation in the paraspinal musculature.  Seems to be more over the sacroiliac joint.  Seems to be right greater than left.  5 out of 5 strength of the lower extremities  Osteopathic findings   T9 extended rotated and side bent left L2 flexed rotated and side bent right Sacrum right on right     Assessment and Plan:  SI (sacroiliac) joint dysfunction Patient does have sacroiliac dysfunction but did respond extremely well to osteopathic manipulation.  Discussed which activities to do and which ones to avoid.  Increase activity slowly over the course of next several weeks.  Discussed icing regimen and home exercises.  Follow-up again in 6 to 8 weeks otherwise.    Nonallopathic problems  Decision today to treat with OMT was based on Physical Exam  After verbal consent patient was treated with HVLA, ME, FPR techniques in  thoracic, lumbar, and sacral  areas  Patient tolerated the procedure well with improvement in symptoms  Patient given exercises, stretches and lifestyle modifications  See medications in patient instructions if given  Patient will follow up in 4-8 weeks     The above documentation has been reviewed and is accurate and complete Judi Saa, DO         Note: This dictation was prepared with Dragon dictation along with smaller phrase technology. Any transcriptional errors that result from this process are unintentional.

## 2022-11-04 ENCOUNTER — Encounter: Payer: Self-pay | Admitting: Family Medicine

## 2022-11-04 ENCOUNTER — Ambulatory Visit (INDEPENDENT_AMBULATORY_CARE_PROVIDER_SITE_OTHER): Payer: BC Managed Care – PPO | Admitting: Family Medicine

## 2022-11-04 VITALS — BP 124/84 | Ht 66.0 in | Wt 162.0 lb

## 2022-11-04 DIAGNOSIS — M9903 Segmental and somatic dysfunction of lumbar region: Secondary | ICD-10-CM

## 2022-11-04 DIAGNOSIS — M533 Sacrococcygeal disorders, not elsewhere classified: Secondary | ICD-10-CM

## 2022-11-04 DIAGNOSIS — M9902 Segmental and somatic dysfunction of thoracic region: Secondary | ICD-10-CM | POA: Diagnosis not present

## 2022-11-04 DIAGNOSIS — M9904 Segmental and somatic dysfunction of sacral region: Secondary | ICD-10-CM | POA: Diagnosis not present

## 2022-11-04 NOTE — Assessment & Plan Note (Signed)
Patient does have sacroiliac dysfunction but did respond extremely well to osteopathic manipulation.  Discussed which activities to do and which ones to avoid.  Increase activity slowly over the course of next several weeks.  Discussed icing regimen and home exercises.  Follow-up again in 6 to 8 weeks otherwise.

## 2022-11-04 NOTE — Patient Instructions (Signed)
Doing great Don't let holidays get to you See you in 2-3 months

## 2022-11-09 ENCOUNTER — Other Ambulatory Visit (HOSPITAL_BASED_OUTPATIENT_CLINIC_OR_DEPARTMENT_OTHER): Payer: Self-pay

## 2022-11-09 ENCOUNTER — Other Ambulatory Visit (HOSPITAL_COMMUNITY): Payer: Self-pay

## 2022-11-09 MED ORDER — INFLUENZA VIRUS VACC SPLIT PF (FLUZONE) 0.5 ML IM SUSY
0.5000 mL | PREFILLED_SYRINGE | Freq: Once | INTRAMUSCULAR | 0 refills | Status: DC
  Filled 2022-11-09 (×2): qty 0.5, 1d supply, fill #0

## 2023-02-16 NOTE — Progress Notes (Unsigned)
  [name] [address] [place] [address] Phone: [phone] Subjective:   Morene Antu am a scribe for Dr. Katrinka Blazing.   I'm seeing this patient by the request  of:  Ardith Dark, MD  CC: Back and neck pain  [id number]  Keron Koffman is a 38 y.o. female coming in with complaint of back and neck pain. OMT 11/04/2022. Patient states back has been a little sore. Children have been wanting her to hold them more often. Neck has been doing good.   Medications patient has been prescribed: None  Taking:         Reviewed prior external information including notes and imaging from previsou exam, outside providers and external EMR if available.   As well as notes that were available from care everywhere and other healthcare systems.  Past medical history, social, surgical and family history all reviewed in electronic medical record.  No pertanent information unless stated regarding to the chief complaint.   Past Medical History:  Diagnosis Date   Complication of anesthesia    History of vertigo    Hx of varicella    PONV (postoperative nausea and vomiting)     Allergies  Allergen Reactions   Lactose Intolerance (Gi) Other (See Comments)    Gas, stomach cramps     Review of Systems:  No headache, visual changes, nausea, vomiting, diarrhea, constipation, dizziness, abdominal pain, skin rash, fevers, chills, night sweats, weight loss, swollen lymph nodes, body aches, joint swelling, chest pain, shortness of breath, mood changes. POSITIVE muscle aches  Objective  Blood pressure 120/70, pulse 77, height 5\' 6"  (1.676 m), weight 167 lb (75.8 kg), SpO2 98%, unknown if currently breastfeeding.   General: No apparent distress alert and oriented x3 mood and affect normal, dressed appropriately.  HEENT: Pupils equal, extraocular movements intact  Respiratory: Patient's speak in full sentences and does not appear short of breath   Cardiovascular: No lower extremity edema, non tender, no erythema  MSK:  Back does have some loss lordosis noted.  Some tenderness to palpation in the paraspinal musculature.  Tightness with FABER left greater than right.  Osteopathic findings  T6extended rotated and side bent left L2 flexed rotated and side bent right L3 flexed rotated and side bent left Sacrum right on right     Assessment and Plan:  SI (sacroiliac) joint dysfunction Sacroiliac dysfunction.  Discussed with patient to continue to work on core and stability exercises.  Difficulty with patient having a toddler.  Has been doing a lot of of lifting.  Discussed icing regimen and home exercises, which activities to do and which ones to.  Increase activity slowly.  Follow-up again with me in 6 weeks.    Nonallopathic problems  Decision today to treat with OMT was based on Physical Exam  After verbal consent patient was treated with HVLA, ME, FPR techniques in thoracic, lumbar, and sacral  areas  Patient tolerated the procedure well with improvement in symptoms  Patient given exercises, stretches and lifestyle modifications  See medications in patient instructions if given  Patient will follow up in 4-8 weeks     The above documentation has been reviewed and is accurate and complete Judi Saa, DO         Note: This dictation was prepared with Dragon dictation along with smaller phrase technology. Any transcriptional errors that result from this process are unintentional.

## 2023-02-17 ENCOUNTER — Encounter: Payer: Self-pay | Admitting: Family Medicine

## 2023-02-17 ENCOUNTER — Ambulatory Visit (INDEPENDENT_AMBULATORY_CARE_PROVIDER_SITE_OTHER): Payer: BC Managed Care – PPO | Admitting: Family Medicine

## 2023-02-17 VITALS — BP 120/70 | HR 77 | Ht 66.0 in | Wt 167.0 lb

## 2023-02-17 DIAGNOSIS — M533 Sacrococcygeal disorders, not elsewhere classified: Secondary | ICD-10-CM

## 2023-02-17 DIAGNOSIS — M9904 Segmental and somatic dysfunction of sacral region: Secondary | ICD-10-CM | POA: Diagnosis not present

## 2023-02-17 DIAGNOSIS — M9903 Segmental and somatic dysfunction of lumbar region: Secondary | ICD-10-CM | POA: Diagnosis not present

## 2023-02-17 DIAGNOSIS — M9902 Segmental and somatic dysfunction of thoracic region: Secondary | ICD-10-CM | POA: Diagnosis not present

## 2023-02-17 MED ORDER — PREDNISONE 20 MG PO TABS: 40.0000 mg | ORAL_TABLET | Freq: Every day | ORAL | 0 refills | Status: AC

## 2023-02-17 NOTE — Assessment & Plan Note (Signed)
Sacroiliac dysfunction.  Discussed with patient to continue to work on core and stability exercises.  Difficulty with patient having a toddler.  Has been doing a lot of of lifting.  Discussed icing regimen and home exercises, which activities to do and which ones to.  Increase activity slowly.  Follow-up again with me in 6 weeks.

## 2023-04-05 NOTE — Progress Notes (Unsigned)
  [name] [address] [place] [address] Phone: [phone] Subjective:   Kirsten Hunt, am serving as a scribe for Dr. Antoine Primas.  I'm seeing this patient by the request  of:  Ardith Dark, MD  CC: Back and neck pain follow-up  [id number]  Kirsten Hunt is a 38 y.o. female coming in with complaint of back and neck pain. OMT 02/17/2023. Patient states continues to have some discomfort and pain.  Has been trying to be active.  Has had some knee pain on the right side.  Lateral aspect.  Stopping her from running.  Medications patient has been prescribed: None  Taking:         Reviewed prior external information including notes and imaging from previsou exam, outside providers and external EMR if available.   As well as notes that were available from care everywhere and other healthcare systems.  Past medical history, social, surgical and family history all reviewed in electronic medical record.  No pertanent information unless stated regarding to the chief complaint.   Past Medical History:  Diagnosis Date   Complication of anesthesia    History of vertigo    Hx of varicella    PONV (postoperative nausea and vomiting)     Allergies  Allergen Reactions   Lactose Intolerance (Gi) Other (See Comments)    Gas, stomach cramps     Review of Systems:  No headache, visual changes, nausea, vomiting, diarrhea, constipation, dizziness, abdominal pain, skin rash, fevers, chills, night sweats, weight loss, swollen lymph nodes, body aches, joint swelling, chest pain, shortness of breath, mood changes. POSITIVE muscle aches  Objective  Blood pressure 100/72, pulse 70, height 5\' 6"  (1.676 m), weight 167 lb (75.8 kg), SpO2 99%, unknown if currently breastfeeding.   General: No apparent distress alert and oriented x3 mood and affect normal, dressed appropriately.  HEENT: Pupils equal, extraocular movements intact  Respiratory:  Patient's speak in full sentences and does not appear short of breath  Cardiovascular: No lower extremity edema, non tender, no erythema  Low back does have some tightness noted.  Some tenderness to palpation in the paraspinal musculature.  Tightness with right sided FABER test.  Osteopathic findings  C2 flexed rotated and side bent right C6 flexed rotated and side bent left T3 extended rotated and side bent right inhaled rib T9 extended rotated and side bent left L2 flexed rotated and side bent right L3 flexed rotated and side bent left Sacrum right on right       Assessment and Plan:  No problem-specific Assessment & Plan notes found for this encounter.    Nonallopathic problems  Decision today to treat with OMT was based on Physical Exam  After verbal consent patient was treated with HVLA, ME, FPR techniques in cervical, rib, thoracic, lumbar, and sacral  areas  Patient tolerated the procedure well with improvement in symptoms  Patient given exercises, stretches and lifestyle modifications  See medications in patient instructions if given  Patient will follow up in 4-8 weeks    The above documentation has been reviewed and is accurate and complete Judi Saa, DO          Note: This dictation was prepared with Dragon dictation along with smaller phrase technology. Any transcriptional errors that result from this process are unintentional.

## 2023-04-06 ENCOUNTER — Ambulatory Visit (INDEPENDENT_AMBULATORY_CARE_PROVIDER_SITE_OTHER): Payer: BC Managed Care – PPO | Admitting: Family Medicine

## 2023-04-06 ENCOUNTER — Encounter: Payer: Self-pay | Admitting: Family Medicine

## 2023-04-06 VITALS — BP 100/72 | HR 70 | Ht 66.0 in | Wt 167.0 lb

## 2023-04-06 DIAGNOSIS — M76891 Other specified enthesopathies of right lower limb, excluding foot: Secondary | ICD-10-CM | POA: Insufficient documentation

## 2023-04-06 DIAGNOSIS — M9904 Segmental and somatic dysfunction of sacral region: Secondary | ICD-10-CM

## 2023-04-06 DIAGNOSIS — M533 Sacrococcygeal disorders, not elsewhere classified: Secondary | ICD-10-CM

## 2023-04-06 DIAGNOSIS — M9908 Segmental and somatic dysfunction of rib cage: Secondary | ICD-10-CM | POA: Diagnosis not present

## 2023-04-06 DIAGNOSIS — M9903 Segmental and somatic dysfunction of lumbar region: Secondary | ICD-10-CM

## 2023-04-06 DIAGNOSIS — M9901 Segmental and somatic dysfunction of cervical region: Secondary | ICD-10-CM

## 2023-04-06 DIAGNOSIS — M9902 Segmental and somatic dysfunction of thoracic region: Secondary | ICD-10-CM

## 2023-04-06 NOTE — Assessment & Plan Note (Signed)
 Exercises given for hamstring pathology.  Discussed stride progression

## 2023-04-06 NOTE — Assessment & Plan Note (Signed)
 Chronic problem with exacerbation.  Discussed icing regimen and home exercises, discussed which activities to do and which ones to avoid.  Increase activity slowly.  Still responding well to osteopathic manipulation.  Patient is trying to be more active but finding it difficult.  Follow-up 6 to 8 weeks

## 2023-04-06 NOTE — Patient Instructions (Addendum)
 Great to see you Do prescribed exercises at least 3x a week Shorten stride length See me again in 6-8 weeks

## 2023-05-24 DIAGNOSIS — Z6826 Body mass index (BMI) 26.0-26.9, adult: Secondary | ICD-10-CM | POA: Diagnosis not present

## 2023-05-24 DIAGNOSIS — Z01419 Encounter for gynecological examination (general) (routine) without abnormal findings: Secondary | ICD-10-CM | POA: Diagnosis not present

## 2023-05-24 DIAGNOSIS — Z13220 Encounter for screening for lipoid disorders: Secondary | ICD-10-CM | POA: Diagnosis not present

## 2023-05-24 DIAGNOSIS — Z1329 Encounter for screening for other suspected endocrine disorder: Secondary | ICD-10-CM | POA: Diagnosis not present

## 2023-05-24 DIAGNOSIS — Z130 Encounter for screening for diseases of the blood and blood-forming organs and certain disorders involving the immune mechanism: Secondary | ICD-10-CM | POA: Diagnosis not present

## 2023-05-24 DIAGNOSIS — Z13228 Encounter for screening for other metabolic disorders: Secondary | ICD-10-CM | POA: Diagnosis not present

## 2023-05-24 DIAGNOSIS — Z131 Encounter for screening for diabetes mellitus: Secondary | ICD-10-CM | POA: Diagnosis not present

## 2023-05-26 NOTE — Progress Notes (Unsigned)
  [facility] [address] [place] [address] Phone: [phone] Subjective:   IBryan Caprio, am serving as a scribe for Dr. Ronnell Coins.  I'm seeing this patient by the request  of:  Rodney Clamp, MD  CC: Back and neck pain follow-up  [id number]  Kirsten Hunt is a 38 y.o. female coming in with complaint of back and neck pain. OMT 04/06/2023. Patient states doing well. No new concerns or symptoms.  Medications patient has been prescribed: None  Taking:         Reviewed prior external information including notes and imaging from previsou exam, outside providers and external EMR if available.   As well as notes that were available from care everywhere and other healthcare systems.  Past medical history, social, surgical and family history all reviewed in electronic medical record.  No pertanent information unless stated regarding to the chief complaint.   Past Medical History:  Diagnosis Date   Complication of anesthesia    History of vertigo    Hx of varicella    PONV (postoperative nausea and vomiting)     Allergies  Allergen Reactions   Lactose Intolerance (Gi) Other (See Comments)    Gas, stomach cramps     Review of Systems:  No headache, visual changes, nausea, vomiting, diarrhea, constipation, dizziness, abdominal pain, skin rash, fevers, chills, night sweats, weight loss, swollen lymph nodes, body aches, joint swelling, chest pain, shortness of breath, mood changes. POSITIVE muscle aches  Objective  Blood pressure 118/76, pulse 88, height 5\' 6"  (1.676 m), weight 170 lb (77.1 kg), SpO2 98%, unknown if currently breastfeeding.   General: No apparent distress alert and oriented x3 mood and affect normal, dressed appropriately.  HEENT: Pupils equal, extraocular movements intact  Respiratory: Patient's speak in full sentences and does not appear short of breath  Cardiovascular: No lower extremity edema, non tender, no  erythema  Gait MSK:  Back does have some loss lordosis.  Tightness noted more on the right greater than left.  Positive Faber on the right.  Osteopathic findings   T9 extended rotated and side bent left L2 flexed rotated and side bent right L5 flexed rotated and side bent left Sacrum right on right       Assessment and Plan:  SI (sacroiliac) joint dysfunction Chronic problem.  Is lifting more weights.  Is making some progress.  Discussed icing regimen and home exercises, discussed continuing to be more active.  Patient's youngest kid is 19 years old and is not picking them up as frequently.  Do think that this will be significantly helpful in the long-term.  Follow-up again in 6 to 8 weeks otherwise.    Nonallopathic problems  Decision today to treat with OMT was based on Physical Exam  After verbal consent patient was treated with HVLA, ME, FPR techniques in  thoracic, lumbar, and sacral  areas  Patient tolerated the procedure well with improvement in symptoms  Patient given exercises, stretches and lifestyle modifications  See medications in patient instructions if given  Patient will follow up in 4-8 weeks    The above documentation has been reviewed and is accurate and complete Kirsten Demarco M Goebel Hellums, DO          Note: This dictation was prepared with Dragon dictation along with smaller phrase technology. Any transcriptional errors that result from this process are unintentional.

## 2023-05-31 ENCOUNTER — Ambulatory Visit (INDEPENDENT_AMBULATORY_CARE_PROVIDER_SITE_OTHER): Admitting: Family Medicine

## 2023-05-31 ENCOUNTER — Encounter: Payer: Self-pay | Admitting: Family Medicine

## 2023-05-31 VITALS — BP 118/76 | HR 88 | Ht 66.0 in | Wt 170.0 lb

## 2023-05-31 DIAGNOSIS — M533 Sacrococcygeal disorders, not elsewhere classified: Secondary | ICD-10-CM

## 2023-05-31 DIAGNOSIS — M9904 Segmental and somatic dysfunction of sacral region: Secondary | ICD-10-CM | POA: Diagnosis not present

## 2023-05-31 DIAGNOSIS — M9903 Segmental and somatic dysfunction of lumbar region: Secondary | ICD-10-CM | POA: Diagnosis not present

## 2023-05-31 DIAGNOSIS — M9902 Segmental and somatic dysfunction of thoracic region: Secondary | ICD-10-CM | POA: Diagnosis not present

## 2023-05-31 NOTE — Assessment & Plan Note (Signed)
 Chronic problem.  Is lifting more weights.  Is making some progress.  Discussed icing regimen and home exercises, discussed continuing to be more active.  Patient's youngest kid is 39 years old and is not picking them up as frequently.  Do think that this will be significantly helpful in the long-term.  Follow-up again in 6 to 8 weeks otherwise.

## 2023-08-02 NOTE — Progress Notes (Unsigned)
 Kirsten Hunt Phone: [phone] Subjective:   ISusannah Gully, am serving as a scribe for Dr. [name].  I'm seeing this patient by the request  of:  Kennyth Worth HERO, MD  CC: back and neck   [id number]  Alida Greiner is a 38 y.o. female coming in with complaint of back and neck pain. OMT 05/31/2023. Patient states same per usual.  R side neck and shoulder area has been bothersome.  Medications patient has been prescribed: None  Taking:         Reviewed prior external information including notes and imaging from previsou exam, outside providers and external EMR if available.   As well as notes that were available from care everywhere and other healthcare systems.  Past medical history, social, surgical and family history all reviewed in electronic medical record.  No pertanent information unless stated regarding to the chief complaint.   Past Medical History:  Diagnosis Date   Complication of anesthesia    History of vertigo    Hx of varicella    PONV (postoperative nausea and vomiting)     Allergies  Allergen Reactions   Lactose Intolerance (Gi) Other (See Comments)    Gas, stomach cramps     Review of Systems:  No headache, visual changes, nausea, vomiting, diarrhea, constipation, dizziness, abdominal pain, skin rash, fevers, chills, night sweats, weight loss, swollen lymph nodes, body aches, joint swelling, chest pain, shortness of breath, mood changes. POSITIVE muscle aches  Objective  Blood pressure 108/70, pulse 80, height 5' 6 (1.676 m), weight 158 lb (71.7 kg), SpO2 97%, unknown if currently breastfeeding.   General: No apparent distress alert and oriented x3 mood and affect normal, dressed appropriately.  HEENT: Pupils equal, extraocular movements intact  Respiratory: Patient's speak in full sentences and does not appear short of breath  Cardiovascular: No lower extremity edema,  non tender, no erythema  MSK:  Back low back exam does have some tightness noted.  Seems to be in the paraspinal  Osteopathic findings  C3 flexed rotated and side bent right C7 flexed rotated and side bent left T3 extended rotated and side bent right inhaled rib T8 extended rotated and side bent left L3 flexed rotated and side bent right Sacrum right on right       Assessment and Plan:  SI (sacroiliac) joint dysfunction Patient has made unbelievable progress at this time.  Has had the phenomenal with her weight loss.  Encouraged her to continue to stay active and continuing the core strengthening.  Feel like she has any knowledge of the willpower to continue to do so.  Follow-up with me again in 6 to 8 weeks otherwise.    Nonallopathic problems  Decision today to treat with OMT was based on Physical Exam  After verbal consent patient was treated with HVLA, ME, FPR techniques in cervical, rib, thoracic, lumbar, and sacral  areas avoided HVLA on the cervical spine  Patient tolerated the procedure well with improvement in symptoms  Patient given exercises, stretches and lifestyle modifications  See medications in patient instructions if given  Patient will follow up in 12 weeks     The above documentation has been reviewed and is accurate and complete Nga Rabon M Nyal Schachter, DO         Note: This dictation was prepared with Dragon dictation along with smaller phrase technology. Any transcriptional errors that result from this process are unintentional.

## 2023-08-03 ENCOUNTER — Encounter: Payer: Self-pay | Admitting: Family Medicine

## 2023-08-03 ENCOUNTER — Ambulatory Visit (INDEPENDENT_AMBULATORY_CARE_PROVIDER_SITE_OTHER): Admitting: Family Medicine

## 2023-08-03 VITALS — BP 108/70 | HR 80 | Ht 66.0 in | Wt 158.0 lb

## 2023-08-03 DIAGNOSIS — M9901 Segmental and somatic dysfunction of cervical region: Secondary | ICD-10-CM

## 2023-08-03 DIAGNOSIS — M9903 Segmental and somatic dysfunction of lumbar region: Secondary | ICD-10-CM

## 2023-08-03 DIAGNOSIS — M9902 Segmental and somatic dysfunction of thoracic region: Secondary | ICD-10-CM

## 2023-08-03 DIAGNOSIS — M9904 Segmental and somatic dysfunction of sacral region: Secondary | ICD-10-CM

## 2023-08-03 DIAGNOSIS — M9908 Segmental and somatic dysfunction of rib cage: Secondary | ICD-10-CM | POA: Diagnosis not present

## 2023-08-03 DIAGNOSIS — M533 Sacrococcygeal disorders, not elsewhere classified: Secondary | ICD-10-CM

## 2023-08-03 NOTE — Patient Instructions (Signed)
Good to see you   See you again in

## 2023-08-03 NOTE — Assessment & Plan Note (Signed)
 Patient has made unbelievable progress at this time.  Has had the phenomenal with her weight loss.  Encouraged her to continue to stay active and continuing the core strengthening.  Feel like she has any knowledge of the willpower to continue to do so.  Follow-up with me again in 6 to 8 weeks otherwise.

## 2023-09-28 ENCOUNTER — Ambulatory Visit: Admitting: Family Medicine

## 2023-10-12 NOTE — Progress Notes (Unsigned)
 [facility] [address] [place] [address] Phone: [phone] Subjective:   Kirsten Hunt, am serving as a scribe for Dr. [name].  I'm seeing this patient by the request  of:  Kennyth Worth HERO, MD  CC: Low back pain  YEP:Dlagzrupcz  Kirsten Hunt is a 38 y.o. female coming in with complaint of back and neck pain. OMT 08/03/2023. Patient states that her neck is painful today. Isaiah doing ok.   Medications patient has been prescribed: None  Taking:         Reviewed prior external information including notes and imaging from previsou exam, outside providers and external EMR if available.   As well as notes that were available from care everywhere and other healthcare systems.  Past medical history, social, surgical and family history all reviewed in electronic medical record.  No pertanent information unless stated regarding to the chief complaint.   Past Medical History:  Diagnosis Date   Complication of anesthesia    History of vertigo    Hx of varicella    PONV (postoperative nausea and vomiting)     Allergies  Allergen Reactions   Lactose Intolerance (Gi) Other (See Comments)    Gas, stomach cramps     Review of Systems:  No headache, visual changes, nausea, vomiting, diarrhea, constipation, , abdominal pain, skin rash, fevers, chills, night sweats, weight loss, swollen lymph nodes, body aches, joint swelling, chest pain, shortness of breath, mood changes. POSITIVE muscle aches, continue to have difficulty with dizziness  Objective  Blood pressure 112/84, pulse 80, height 5' 6 (1.676 m), SpO2 99%, unknown if currently breastfeeding.   General: No apparent distress alert and oriented x3 mood and affect normal, dressed appropriately.  HEENT: Pupils equal, extraocular movements intact  Respiratory: Patient's speak in full sentences and does not appear short of breath  Cardiovascular: No lower extremity edema, non  tender, no erythema  Gait relatively normal MSK:  Back does have some tightness noted in the right paraspinal musculature of the lumbar spine.  This seems to be more around the sacroiliac joint.  Positive FABER test.  Right neck does have some limited sidebending to the right and rotation to the left.  Osteopathic findings  C3 flexed rotated and side bent right T3 extended rotated and side bent right inhaled rib T7 extended rotated and side bent left L2 flexed rotated and side bent right Sacrum right on right     Assessment and Plan:  SI (sacroiliac) joint dysfunction Chronic discomfort discussed with patient to continue to work on core strengthening.  Patient is still having some positional vertigo.  Still happens quite often.  Patient is following up with a doctor for this problem.  Discussed icing regimen and home exercises, increase activity slowly.  Discussed icing regimen.  Follow-up again with 6 to 8 weeks.    Nonallopathic problems  Decision today to treat with OMT was based on Physical Exam  After verbal consent patient was treated with HVLA, ME, FPR techniques in cervical, rib, thoracic, lumbar, and sacral  areas avoided any HVLA on the cervical spine  Patient tolerated the procedure well with improvement in symptoms  Patient given exercises, stretches and lifestyle modifications  See medications in patient instructions if given  Patient will follow up in 4-8 weeks     The above documentation has been reviewed and is accurate and complete Arthea HERO Claudene, DO         Note: This dictation  was prepared with Dragon dictation along with smaller phrase technology. Any transcriptional errors that result from this process are unintentional.

## 2023-10-17 ENCOUNTER — Encounter: Payer: Self-pay | Admitting: Family Medicine

## 2023-10-17 ENCOUNTER — Ambulatory Visit: Admitting: Family Medicine

## 2023-10-17 VITALS — BP 112/84 | HR 80 | Ht 66.0 in

## 2023-10-17 DIAGNOSIS — M9903 Segmental and somatic dysfunction of lumbar region: Secondary | ICD-10-CM

## 2023-10-17 DIAGNOSIS — M9904 Segmental and somatic dysfunction of sacral region: Secondary | ICD-10-CM

## 2023-10-17 DIAGNOSIS — M9902 Segmental and somatic dysfunction of thoracic region: Secondary | ICD-10-CM

## 2023-10-17 DIAGNOSIS — M533 Sacrococcygeal disorders, not elsewhere classified: Secondary | ICD-10-CM | POA: Diagnosis not present

## 2023-10-17 DIAGNOSIS — M9908 Segmental and somatic dysfunction of rib cage: Secondary | ICD-10-CM

## 2023-10-17 DIAGNOSIS — M9901 Segmental and somatic dysfunction of cervical region: Secondary | ICD-10-CM | POA: Diagnosis not present

## 2023-10-17 NOTE — Patient Instructions (Signed)
Good to see you   

## 2023-10-17 NOTE — Assessment & Plan Note (Signed)
 Chronic discomfort discussed with patient to continue to work on core strengthening.  Patient is still having some positional vertigo.  Still happens quite often.  Patient is following up with a doctor for this problem.  Discussed icing regimen and home exercises, increase activity slowly.  Discussed icing regimen.  Follow-up again with 6 to 8 weeks.

## 2023-12-22 ENCOUNTER — Ambulatory Visit: Admitting: Family Medicine

## 2024-01-27 NOTE — Progress Notes (Unsigned)
" °  [facility] [address] [place] [address] Phone: [phone] Subjective:    I'm seeing this patient by the request  of:  Kennyth Worth HERO, MD  CC:   YEP:Dlagzrupcz  Kirsten Hunt is a 39 y.o. female coming in with complaint of back and neck pain. OMT 10/18/2023. Patient states   Medications patient has been prescribed: None  Taking:         Reviewed prior external information including notes and imaging from previsou exam, outside providers and external EMR if available.   As well as notes that were available from care everywhere and other healthcare systems.  Past medical history, social, surgical and family history all reviewed in electronic medical record.  No pertanent information unless stated regarding to the chief complaint.   Past Medical History:  Diagnosis Date   Complication of anesthesia    History of vertigo    Hx of varicella    PONV (postoperative nausea and vomiting)     Allergies[1]   Review of Systems:  No headache, visual changes, nausea, vomiting, diarrhea, constipation, dizziness, abdominal pain, skin rash, fevers, chills, night sweats, weight loss, swollen lymph nodes, body aches, joint swelling, chest pain, shortness of breath, mood changes. POSITIVE muscle aches  Objective  unknown if currently breastfeeding.   General: No apparent distress alert and oriented x3 mood and affect normal, dressed appropriately.  HEENT: Pupils equal, extraocular movements intact  Respiratory: Patient's speak in full sentences and does not appear short of breath  Cardiovascular: No lower extremity edema, non tender, no erythema  Gait MSK:  Back   Osteopathic findings  C2 flexed rotated and side bent right C6 flexed rotated and side bent left T3 extended rotated and side bent right inhaled rib T9 extended rotated and side bent left L2 flexed rotated and side bent right Sacrum right on right       Assessment and  Plan:  No problem-specific Assessment & Plan notes found for this encounter.    Nonallopathic problems  Decision today to treat with OMT was based on Physical Exam  After verbal consent patient was treated with HVLA, ME, FPR techniques in cervical, rib, thoracic, lumbar, and sacral  areas  Patient tolerated the procedure well with improvement in symptoms  Patient given exercises, stretches and lifestyle modifications  See medications in patient instructions if given  Patient will follow up in 4-8 weeks             Note: This dictation was prepared with Dragon dictation along with smaller phrase technology. Any transcriptional errors that result from this process are unintentional.            [1]  Allergies Allergen Reactions   Lactose Intolerance (Gi) Other (See Comments)    Gas, stomach cramps   "

## 2024-02-01 ENCOUNTER — Ambulatory Visit: Admitting: Family Medicine
